# Patient Record
Sex: Male | Born: 1950 | Race: White | Hispanic: No | Marital: Married | Smoking: Former smoker
Health system: Southern US, Community
[De-identification: ages and names within clinical notes are randomized; demographics above are authoritative.]

## PROBLEM LIST (undated history)

## (undated) DIAGNOSIS — I499 Cardiac arrhythmia, unspecified: Secondary | ICD-10-CM

## (undated) DIAGNOSIS — I10 Essential (primary) hypertension: Secondary | ICD-10-CM

## (undated) DIAGNOSIS — I498 Other specified cardiac arrhythmias: Secondary | ICD-10-CM

## (undated) DIAGNOSIS — R351 Nocturia: Secondary | ICD-10-CM

## (undated) DIAGNOSIS — E119 Type 2 diabetes mellitus without complications: Secondary | ICD-10-CM

## (undated) DIAGNOSIS — E785 Hyperlipidemia, unspecified: Secondary | ICD-10-CM

## (undated) DIAGNOSIS — I493 Ventricular premature depolarization: Secondary | ICD-10-CM

## (undated) HISTORY — DX: Hyperlipidemia, unspecified: E78.5

## (undated) HISTORY — PX: CARDIAC CATHETERIZATION: SHX172

## (undated) HISTORY — DX: Type 2 diabetes mellitus without complications: E11.9

## (undated) HISTORY — PX: HERNIA REPAIR: SHX51

## (undated) HISTORY — PX: ADENOIDECTOMY AND MYRINGOTOMY WITH TUBE PLACEMENT: SHX5714

## (undated) HISTORY — DX: Other specified cardiac arrhythmias: I49.8

## (undated) HISTORY — DX: Essential (primary) hypertension: I10

## (undated) HISTORY — DX: Nocturia: R35.1

## (undated) HISTORY — DX: Ventricular premature depolarization: I49.3

## (undated) HISTORY — DX: Cardiac arrhythmia, unspecified: I49.9

---

## 2003-03-05 ENCOUNTER — Encounter: Payer: Self-pay | Admitting: Emergency Medicine

## 2003-03-05 ENCOUNTER — Emergency Department (HOSPITAL_COMMUNITY): Admission: EM | Admit: 2003-03-05 | Discharge: 2003-03-05 | Payer: Self-pay | Admitting: Emergency Medicine

## 2008-06-23 ENCOUNTER — Ambulatory Visit (HOSPITAL_COMMUNITY): Admission: EM | Admit: 2008-06-23 | Discharge: 2008-06-23 | Payer: Self-pay | Admitting: Emergency Medicine

## 2011-04-03 NOTE — Op Note (Signed)
NAME:  Benjamin Erickson, TROMPETER NO.:  [id number]   MEDICAL RECORD NO.:  [id number]          PATIENT TYPE:  EMS   LOCATION:  ED                           FACILITY:  [facility]   PHYSICIAN:  Zola Button T. Lazarus Salines, M.D. DATE OF BIRTH:  November 04, 1951   DATE OF PROCEDURE:  06/23/2008  DATE OF DISCHARGE:  06/23/2008                               OPERATIVE REPORT   PREOPERATIVE DIAGNOSIS:  Esophageal foreign body.   POSTOPERATIVE DIAGNOSIS:  Esophageal foreign body.   PROCEDURE PERFORMED:  Direct laryngoscopy, esophagoscopy with retrieval  of upper esophageal foreign body.   SURGEON:  Gloris Manchester. Lazarus Salines, MD   ANESTHESIA:  General orotracheal.   BLOOD LOSS:  None.   COMPLICATIONS:  Minor right lower lip contusion.   FINDINGS:  A roughly 3 x 2 x 5 cm impacted piece of apparent chicken  meat with what looks to be part of which bone imbedded therein with one  sharp and one sharp end.  No significant injury to the esophagus, and no  additional foreign bodies to the extension of the cervical  esophagoscope, full length.   PROCEDURE:  With the patient in a comfortable supine position, general  orotracheal anesthesia was induced without difficulty.  An appropriate  level, the table was turned 90 degrees, and the patient placed in  reverse Trendelenburg.  A rubber tooth guard was placed.  Taking care to  protect lips, teeth, and endotracheal tube, the large laser anterior  commissure laryngoscope was introduced and passed into the hypopharynx.  I could enter the tip into the cricopharyngeus and I could visualize the  foreign body but I could not be extended far enough owing to the bulk at  the proximal end.  The scope was removed.  The Hollinger laryngoscope  was introduced and through the cricopharyngeus and the apparent foreign  body was grasped with a large cup forceps and the foreign body, forceps,  and scope were all extracted together revealing a large foreign body  which was passed from  the field.  Reinspection with the Hollinger scope  revealed additional pieces of chicken which were smaller and which were  extracted.   The cervical esophagoscope was lubricated and introduced and there was a  small additional piece of foreign material just below the  cricopharyngeus, which was evacuated with a cup forceps.  The  esophagoscope was then passed into its full length with no additional  findings and it was removed.  The direct laryngoscope was reintroduced  and entire pharynx was carefully inspected to make sure there were no  residual foreign bodies floating around in the secretions.  The pharynx  was carefully suctioned to clear.  At this point, the procedure was  completed.  The rubber tooth guard was removed.  There was a contusion  to the right lower lip.  The foreign body was saved for the family.  The  patient was awakened, extubated, and transferred to the recovery in  stable condition.   COMMENT:  A 60 year old white male ingested a chunk of chicken with an  apparent bone contained therein as witnessed by CT scan of  approximately  8 hours ago.  This was retrieved.  Anticipate routine postoperative  recovery with attention to advancement of diet and possible reflux  therapy.  Given low anticipated risk of postanesthetic or postsurgical  complications, I feel outpatient venue is appropriate.      Gloris Manchester. Lazarus Salines, M.D.  Electronically Signed     KTW/MEDQ  D:  06/23/2008  T:  06/24/2008  Job:  [id number]

## 2011-04-03 NOTE — Consult Note (Signed)
NAME:  Benjamin Erickson, Benjamin Erickson NO.:  [id number]   MEDICAL RECORD NO.:  [id number]          PATIENT TYPE:  EMS   LOCATION:  ED                           FACILITY:  [facility]   PHYSICIAN:  Zola Button T. Lazarus Salines, M.D. DATE OF BIRTH:  10/04/1951   DATE OF CONSULTATION:  06/23/2008  DATE OF DISCHARGE:  06/23/2008                                 CONSULTATION   CHIEF COMPLAINT:  Food lodged in throat.   HISTORY:  A 60 year old white male was eating [employer] chicken for  lunch.  He took a large bite and swallowed and got obstructed where he  could not breathe.  His wife performed the Heimlich maneuver four times  and finally the food dislodged where he could breathe, and then, he  managed to partly swallow it and it got stuck.  It has remained stuck  ever since now approximately 6-1/2 hours.  There was some pain involved.  He is drooling slightly.  No breathing difficulty.  His voice is  slightly hoarse.  No past history of food impactions.  He does have  frequent reflux for which he uses simple antacid such as Tums, but has  never had dysphagia prior.  No blood.  No fever.   PHYSICAL EXAMINATION:  GENERAL:  This is a heavy-set distressed-  appearing middle-aged white male.  His voice is hoarse, but he is  breathing comfortably without stridor.  Mental status is basically  appropriate.  He has a somewhat bulky tongue.  NECK:  Unremarkable.  LUNGS:  Clear to auscultation.  HEART:  Regular rate and rhythm.  No murmurs.  ABDOMEN:  Obese, but active.   Soft tissue lateral of the neck shows no foreign body.   A CT scan of the neck does show soft tissue foreign body just behind the  larynx with an apparent central bone or bone density.  No evidence of  soft tissue air distant from the esophagus.   IMPRESSION:  High cervical esophageal food impaction with no current  airway threat.   PLAN:  I discussed this with the patient and his wife.  We need to go to  the operating room where  under general anesthesia, I will perform a  direct laryngoscopy in an attempt to extract the foreign body.  Beyond  this, he can probably go home and will likely not need any specific  medications.  I would recommend some over-the-counter Prilosec to help  control his reflux during the healing phase.   I discussed the surgery in detail including risks and complications.  Questions were answered and informed consent was obtained.  A routine  preoperative history and physical was recorded without  contraindications.      Gloris Manchester. Lazarus Salines, M.D.  Electronically Signed    KTW/MEDQ  D:  06/23/2008  T:  06/24/2008  Job:  [id number]

## 2011-08-17 LAB — POCT I-STAT, CHEM 8
Calcium, Ion: 1.18
Glucose, Bld: 139 — ABNORMAL HIGH
HCT: 54 — ABNORMAL HIGH
Hemoglobin: 18.4 — ABNORMAL HIGH

## 2016-06-26 ENCOUNTER — Encounter: Payer: Self-pay | Admitting: Skilled Nursing Facility1

## 2016-06-26 ENCOUNTER — Encounter: Payer: BLUE CROSS/BLUE SHIELD | Attending: Family Medicine | Admitting: Skilled Nursing Facility1

## 2016-06-26 DIAGNOSIS — E119 Type 2 diabetes mellitus without complications: Secondary | ICD-10-CM | POA: Insufficient documentation

## 2016-06-26 DIAGNOSIS — Z713 Dietary counseling and surveillance: Secondary | ICD-10-CM | POA: Diagnosis not present

## 2016-06-26 NOTE — Progress Notes (Signed)

## 2016-07-03 ENCOUNTER — Encounter: Payer: BLUE CROSS/BLUE SHIELD | Admitting: Dietician

## 2016-07-03 DIAGNOSIS — Z713 Dietary counseling and surveillance: Secondary | ICD-10-CM | POA: Diagnosis not present

## 2016-07-03 DIAGNOSIS — E119 Type 2 diabetes mellitus without complications: Secondary | ICD-10-CM

## 2016-07-05 NOTE — Progress Notes (Signed)
Patient was seen on 8/15/17for the second of a series of three diabetes self-management courses at the [facility]. The following learning objectives were met by the patient during this class:   Describe the role of different macronutrients on glucose  Explain how carbohydrates affect blood glucose  State what foods contain the most carbohydrates  Demonstrate carbohydrate counting  Demonstrate how to read Nutrition Facts food label  Describe effects of various fats on heart health  Describe the importance of good nutrition for health and healthy eating strategies  Describe techniques for managing your shopping, cooking and meal planning  List strategies to follow meal plan when dining out  Describe the effects of alcohol on glucose and how to use it safely  Goals:  Follow Diabetes Meal Plan as instructed  Eat 3 meals and 2 snacks, every 3-5 hrs  Limit carbohydrate intake to 45 grams carbohydrate/meal Limit carbohydrate intake to 15 grams carbohydrate/snack Add lean protein foods to meals/snacks  Monitor glucose levels as instructed by your doctor   Follow-Up Plan:  Attend Core 3  Work towards following your personal food plan.   

## 2016-07-10 ENCOUNTER — Encounter: Payer: BLUE CROSS/BLUE SHIELD | Admitting: Skilled Nursing Facility1

## 2016-07-10 DIAGNOSIS — Z713 Dietary counseling and surveillance: Secondary | ICD-10-CM | POA: Diagnosis not present

## 2016-07-10 DIAGNOSIS — E119 Type 2 diabetes mellitus without complications: Secondary | ICD-10-CM

## 2016-07-12 ENCOUNTER — Encounter: Payer: Self-pay | Admitting: Skilled Nursing Facility1

## 2016-07-12 NOTE — Progress Notes (Signed)
Patient was seen on 07/10/2016 for the third of a series of three diabetes self-management courses at the [facility]. The following learning objectives were met by the patient during this class:  . State the amount of activity recommended for healthy living . Describe activities suitable for individual needs . Identify ways to regularly incorporate activity into daily life . Identify barriers to activity and ways to over come these barriers  Identify diabetes medications being personally used and their primary action for lowering glucose and possible side effects . Describe role of stress on blood glucose and develop strategies to address psychosocial issues . Identify diabetes complications and ways to prevent them  Explain how to manage diabetes during illness . Evaluate success in meeting personal goal . Establish 2-3 goals that they will plan to diligently work on until they return for the  59-monthfollow-up visit  Goals:   I will count my carb choices at most meals and snacks  I will be active 30 minutes or more 6 times a week  I will take my diabetes medications as scheduled  I will eat less unhealthy fats by eating less snacks  To help manage stress I will  exercise at least 6 times a week   Your patient has identified these potential barriers to change:  Motivation Your patient has identified their diabetes self-care support plan as  Family Support  Plan:  Attend Monthly Diabetes Support Group as needed or make a future follow up appointment

## 2016-11-09 DIAGNOSIS — Z23 Encounter for immunization: Secondary | ICD-10-CM | POA: Diagnosis not present

## 2016-11-09 DIAGNOSIS — I10 Essential (primary) hypertension: Secondary | ICD-10-CM | POA: Diagnosis not present

## 2016-11-09 DIAGNOSIS — E119 Type 2 diabetes mellitus without complications: Secondary | ICD-10-CM | POA: Diagnosis not present

## 2016-11-09 DIAGNOSIS — E785 Hyperlipidemia, unspecified: Secondary | ICD-10-CM | POA: Diagnosis not present

## 2017-04-22 DIAGNOSIS — M7660 Achilles tendinitis, unspecified leg: Secondary | ICD-10-CM | POA: Diagnosis not present

## 2017-04-24 DIAGNOSIS — M7661 Achilles tendinitis, right leg: Secondary | ICD-10-CM | POA: Diagnosis not present

## 2017-04-25 DIAGNOSIS — S86011D Strain of right Achilles tendon, subsequent encounter: Secondary | ICD-10-CM | POA: Diagnosis not present

## 2017-04-25 DIAGNOSIS — M7661 Achilles tendinitis, right leg: Secondary | ICD-10-CM | POA: Diagnosis not present

## 2017-04-29 DIAGNOSIS — S86011D Strain of right Achilles tendon, subsequent encounter: Secondary | ICD-10-CM | POA: Diagnosis not present

## 2017-04-29 DIAGNOSIS — M7661 Achilles tendinitis, right leg: Secondary | ICD-10-CM | POA: Diagnosis not present

## 2017-05-02 DIAGNOSIS — M7661 Achilles tendinitis, right leg: Secondary | ICD-10-CM | POA: Diagnosis not present

## 2017-05-02 DIAGNOSIS — S86011D Strain of right Achilles tendon, subsequent encounter: Secondary | ICD-10-CM | POA: Diagnosis not present

## 2017-05-06 DIAGNOSIS — S86011D Strain of right Achilles tendon, subsequent encounter: Secondary | ICD-10-CM | POA: Diagnosis not present

## 2017-05-06 DIAGNOSIS — M7661 Achilles tendinitis, right leg: Secondary | ICD-10-CM | POA: Diagnosis not present

## 2017-05-09 DIAGNOSIS — S86011D Strain of right Achilles tendon, subsequent encounter: Secondary | ICD-10-CM | POA: Diagnosis not present

## 2017-05-09 DIAGNOSIS — M7661 Achilles tendinitis, right leg: Secondary | ICD-10-CM | POA: Diagnosis not present

## 2017-05-16 DIAGNOSIS — M7661 Achilles tendinitis, right leg: Secondary | ICD-10-CM | POA: Diagnosis not present

## 2017-05-16 DIAGNOSIS — S86011D Strain of right Achilles tendon, subsequent encounter: Secondary | ICD-10-CM | POA: Diagnosis not present

## 2017-05-20 DIAGNOSIS — M7661 Achilles tendinitis, right leg: Secondary | ICD-10-CM | POA: Diagnosis not present

## 2017-05-20 DIAGNOSIS — S86011D Strain of right Achilles tendon, subsequent encounter: Secondary | ICD-10-CM | POA: Diagnosis not present

## 2017-05-23 DIAGNOSIS — S86011D Strain of right Achilles tendon, subsequent encounter: Secondary | ICD-10-CM | POA: Diagnosis not present

## 2017-05-23 DIAGNOSIS — M7661 Achilles tendinitis, right leg: Secondary | ICD-10-CM | POA: Diagnosis not present

## 2017-05-27 DIAGNOSIS — M7661 Achilles tendinitis, right leg: Secondary | ICD-10-CM | POA: Diagnosis not present

## 2017-05-31 ENCOUNTER — Encounter: Payer: Self-pay | Admitting: Cardiology

## 2017-05-31 DIAGNOSIS — I493 Ventricular premature depolarization: Secondary | ICD-10-CM | POA: Diagnosis not present

## 2017-05-31 DIAGNOSIS — E785 Hyperlipidemia, unspecified: Secondary | ICD-10-CM | POA: Diagnosis not present

## 2017-05-31 DIAGNOSIS — Z125 Encounter for screening for malignant neoplasm of prostate: Secondary | ICD-10-CM | POA: Diagnosis not present

## 2017-05-31 DIAGNOSIS — K469 Unspecified abdominal hernia without obstruction or gangrene: Secondary | ICD-10-CM | POA: Diagnosis not present

## 2017-05-31 DIAGNOSIS — E119 Type 2 diabetes mellitus without complications: Secondary | ICD-10-CM | POA: Diagnosis not present

## 2017-05-31 DIAGNOSIS — I10 Essential (primary) hypertension: Secondary | ICD-10-CM | POA: Diagnosis not present

## 2017-05-31 DIAGNOSIS — Z0000 Encounter for general adult medical examination without abnormal findings: Secondary | ICD-10-CM | POA: Diagnosis not present

## 2017-06-06 ENCOUNTER — Telehealth: Payer: Self-pay | Admitting: Cardiology

## 2017-06-06 NOTE — Telephone Encounter (Signed)
Received records from [facility] for appointment on 07/09/17 with Dr Stanford Breed.  Records put with Dr Jacalyn Lefevre schedule for 07/09/17. lp

## 2017-06-27 DIAGNOSIS — K4021 Bilateral inguinal hernia, without obstruction or gangrene, recurrent: Secondary | ICD-10-CM | POA: Diagnosis not present

## 2017-07-04 NOTE — Progress Notes (Deleted)
    Daphine Deutscher, MD Reason for referral-PVCs, palpitations  HPI: 66 year old male for evaluation of PVCs and palpitations at request of London Pepper M.D.  Current Outpatient Prescriptions  Medication Sig Dispense Refill  . acetaminophen (TYLENOL) 325 MG tablet Take 650 mg by mouth every 6 (six) hours as needed.    Marland Kitchen lisinopril-hydrochlorothiazide (PRINZIDE,ZESTORETIC) 10-12.5 MG tablet Take 1 tablet by mouth daily.    . rosuvastatin (CRESTOR) 5 MG tablet Take 5 mg by mouth daily.     No current facility-administered medications for this visit.     Not on File  Past Medical History:  Diagnosis Date  . Bigeminy   . DM (diabetes mellitus) ([place])   . Frequent PVCs   . Hyperlipidemia   . Hypertension   . Nocturia     Past Surgical History:  Procedure Laterality Date  . ADENOIDECTOMY AND MYRINGOTOMY WITH TUBE PLACEMENT    . HERNIA REPAIR      Social History   Social History  . Marital status: Married    Spouse name: N/A  . Number of children: N/A  . Years of education: N/A   Occupational History  . Not on file.   Social History Main Topics  . Smoking status: Former Research scientist (life sciences)  . Smokeless tobacco: Not on file  . Alcohol use Not on file  . Drug use: Unknown  . Sexual activity: Not on file   Other Topics Concern  . Not on file   Social History Narrative  . No narrative on file    Family History  Problem Relation Age of Onset  . Skin cancer Mother   . Throat cancer Father   . Heart failure Father     ROS: no fevers or chills, productive cough, hemoptysis, dysphasia, odynophagia, melena, hematochezia, dysuria, hematuria, rash, seizure activity, orthopnea, PND, pedal edema, claudication. Remaining systems are negative.  Physical Exam:   There were no vitals taken for this visit.  General:  Well developed/well nourished in NAD Skin warm/dry Patient not depressed No peripheral clubbing Back-normal HEENT-normal/normal eyelids Neck supple/normal  carotid upstroke bilaterally; no bruits; no JVD; no thyromegaly chest - CTA/ normal expansion CV - RRR/normal S1 and S2; no murmurs, rubs or gallops;  PMI nondisplaced Abdomen -NT/ND, no HSM, no mass, + bowel sounds, no bruit 2+ femoral pulses, no bruits Ext-no edema, chords, 2+ DP Neuro-grossly nonfocal  ECG - personally reviewed  A/P  1  Kirk Ruths, MD

## 2017-07-09 ENCOUNTER — Ambulatory Visit: Payer: Medicare Other | Admitting: Cardiology

## 2017-12-03 DIAGNOSIS — R3912 Poor urinary stream: Secondary | ICD-10-CM | POA: Diagnosis not present

## 2017-12-03 DIAGNOSIS — N401 Enlarged prostate with lower urinary tract symptoms: Secondary | ICD-10-CM | POA: Diagnosis not present

## 2017-12-03 DIAGNOSIS — N5201 Erectile dysfunction due to arterial insufficiency: Secondary | ICD-10-CM | POA: Diagnosis not present

## 2017-12-03 DIAGNOSIS — N411 Chronic prostatitis: Secondary | ICD-10-CM | POA: Diagnosis not present

## 2018-01-17 DIAGNOSIS — I10 Essential (primary) hypertension: Secondary | ICD-10-CM | POA: Diagnosis not present

## 2018-01-17 DIAGNOSIS — E785 Hyperlipidemia, unspecified: Secondary | ICD-10-CM | POA: Diagnosis not present

## 2018-01-17 DIAGNOSIS — E119 Type 2 diabetes mellitus without complications: Secondary | ICD-10-CM | POA: Diagnosis not present

## 2018-05-19 DIAGNOSIS — N411 Chronic prostatitis: Secondary | ICD-10-CM | POA: Diagnosis not present

## 2018-05-19 DIAGNOSIS — R3912 Poor urinary stream: Secondary | ICD-10-CM | POA: Diagnosis not present

## 2018-05-19 DIAGNOSIS — N410 Acute prostatitis: Secondary | ICD-10-CM | POA: Diagnosis not present

## 2018-06-03 DIAGNOSIS — Z125 Encounter for screening for malignant neoplasm of prostate: Secondary | ICD-10-CM | POA: Diagnosis not present

## 2018-06-03 DIAGNOSIS — E119 Type 2 diabetes mellitus without complications: Secondary | ICD-10-CM | POA: Diagnosis not present

## 2018-06-03 DIAGNOSIS — Z7984 Long term (current) use of oral hypoglycemic drugs: Secondary | ICD-10-CM | POA: Diagnosis not present

## 2018-06-03 DIAGNOSIS — E291 Testicular hypofunction: Secondary | ICD-10-CM | POA: Diagnosis not present

## 2018-06-03 DIAGNOSIS — Z23 Encounter for immunization: Secondary | ICD-10-CM | POA: Diagnosis not present

## 2018-06-03 DIAGNOSIS — E785 Hyperlipidemia, unspecified: Secondary | ICD-10-CM | POA: Diagnosis not present

## 2018-06-03 DIAGNOSIS — I10 Essential (primary) hypertension: Secondary | ICD-10-CM | POA: Diagnosis not present

## 2018-06-03 DIAGNOSIS — Z0000 Encounter for general adult medical examination without abnormal findings: Secondary | ICD-10-CM | POA: Diagnosis not present

## 2018-09-01 DIAGNOSIS — Z23 Encounter for immunization: Secondary | ICD-10-CM | POA: Diagnosis not present

## 2018-10-24 DIAGNOSIS — R198 Other specified symptoms and signs involving the digestive system and abdomen: Secondary | ICD-10-CM | POA: Diagnosis not present

## 2018-11-10 DIAGNOSIS — N401 Enlarged prostate with lower urinary tract symptoms: Secondary | ICD-10-CM | POA: Diagnosis not present

## 2018-11-17 DIAGNOSIS — N400 Enlarged prostate without lower urinary tract symptoms: Secondary | ICD-10-CM | POA: Diagnosis not present

## 2018-11-17 DIAGNOSIS — R3912 Poor urinary stream: Secondary | ICD-10-CM | POA: Diagnosis not present

## 2018-12-04 DIAGNOSIS — E785 Hyperlipidemia, unspecified: Secondary | ICD-10-CM | POA: Diagnosis not present

## 2018-12-04 DIAGNOSIS — R972 Elevated prostate specific antigen [PSA]: Secondary | ICD-10-CM | POA: Diagnosis not present

## 2018-12-04 DIAGNOSIS — E669 Obesity, unspecified: Secondary | ICD-10-CM | POA: Diagnosis not present

## 2018-12-04 DIAGNOSIS — E119 Type 2 diabetes mellitus without complications: Secondary | ICD-10-CM | POA: Diagnosis not present

## 2018-12-04 DIAGNOSIS — I10 Essential (primary) hypertension: Secondary | ICD-10-CM | POA: Diagnosis not present

## 2018-12-04 DIAGNOSIS — E1169 Type 2 diabetes mellitus with other specified complication: Secondary | ICD-10-CM | POA: Diagnosis not present

## 2019-05-28 DIAGNOSIS — L814 Other melanin hyperpigmentation: Secondary | ICD-10-CM | POA: Diagnosis not present

## 2019-05-28 DIAGNOSIS — D1801 Hemangioma of skin and subcutaneous tissue: Secondary | ICD-10-CM | POA: Diagnosis not present

## 2019-05-28 DIAGNOSIS — Z85828 Personal history of other malignant neoplasm of skin: Secondary | ICD-10-CM | POA: Diagnosis not present

## 2019-05-28 DIAGNOSIS — L819 Disorder of pigmentation, unspecified: Secondary | ICD-10-CM | POA: Diagnosis not present

## 2019-05-28 DIAGNOSIS — L570 Actinic keratosis: Secondary | ICD-10-CM | POA: Diagnosis not present

## 2019-05-28 DIAGNOSIS — L821 Other seborrheic keratosis: Secondary | ICD-10-CM | POA: Diagnosis not present

## 2019-05-28 DIAGNOSIS — D229 Melanocytic nevi, unspecified: Secondary | ICD-10-CM | POA: Diagnosis not present

## 2019-09-30 ENCOUNTER — Other Ambulatory Visit: Payer: Self-pay

## 2019-09-30 DIAGNOSIS — Z20822 Contact with and (suspected) exposure to covid-19: Secondary | ICD-10-CM

## 2019-09-30 DIAGNOSIS — Z20828 Contact with and (suspected) exposure to other viral communicable diseases: Secondary | ICD-10-CM | POA: Diagnosis not present

## 2019-10-02 LAB — NOVEL CORONAVIRUS, NAA: SARS-CoV-2, NAA: DETECTED — AB

## 2019-10-06 ENCOUNTER — Emergency Department (HOSPITAL_COMMUNITY): Payer: Medicare Other

## 2019-10-06 ENCOUNTER — Emergency Department (HOSPITAL_COMMUNITY)
Admission: EM | Admit: 2019-10-06 | Discharge: 2019-10-06 | Disposition: A | Discharge status: Home / Self Care | Payer: Medicare Other | Attending: Emergency Medicine | Admitting: Emergency Medicine

## 2019-10-06 DIAGNOSIS — U071 COVID-19: Secondary | ICD-10-CM | POA: Diagnosis not present

## 2019-10-06 DIAGNOSIS — R0602 Shortness of breath: Secondary | ICD-10-CM | POA: Diagnosis not present

## 2019-10-06 DIAGNOSIS — R918 Other nonspecific abnormal finding of lung field: Secondary | ICD-10-CM | POA: Diagnosis not present

## 2019-10-06 DIAGNOSIS — Z79899 Other long term (current) drug therapy: Secondary | ICD-10-CM | POA: Insufficient documentation

## 2019-10-06 DIAGNOSIS — E119 Type 2 diabetes mellitus without complications: Secondary | ICD-10-CM | POA: Diagnosis not present

## 2019-10-06 DIAGNOSIS — Z87891 Personal history of nicotine dependence: Secondary | ICD-10-CM | POA: Insufficient documentation

## 2019-10-06 DIAGNOSIS — I10 Essential (primary) hypertension: Secondary | ICD-10-CM | POA: Diagnosis not present

## 2019-10-06 DIAGNOSIS — J80 Acute respiratory distress syndrome: Secondary | ICD-10-CM | POA: Diagnosis not present

## 2019-10-06 LAB — CBC WITH DIFFERENTIAL/PLATELET
Abs Immature Granulocytes: 0.04 10*3/uL (ref 0.00–0.07)
Basophils Absolute: 0 10*3/uL (ref 0.0–0.1)
Basophils Relative: 0 %
Eosinophils Absolute: 0 10*3/uL (ref 0.0–0.5)
Eosinophils Relative: 0 %
HCT: 52 % (ref 39.0–52.0)
Hemoglobin: 16.5 g/dL (ref 13.0–17.0)
Immature Granulocytes: 1 %
Lymphocytes Relative: 17 %
Lymphs Abs: 1.2 10*3/uL (ref 0.7–4.0)
MCH: 30.3 pg (ref 26.0–34.0)
MCHC: 31.7 g/dL (ref 30.0–36.0)
MCV: 95.4 fL (ref 80.0–100.0)
Monocytes Absolute: 1 10*3/uL (ref 0.1–1.0)
Monocytes Relative: 13 %
Neutro Abs: 5.2 10*3/uL (ref 1.7–7.7)
Neutrophils Relative %: 69 %
Platelets: 284 10*3/uL (ref 150–400)
RBC: 5.45 MIL/uL (ref 4.22–5.81)
RDW: 13.2 % (ref 11.5–15.5)
WBC: 7.5 10*3/uL (ref 4.0–10.5)
nRBC: 0 % (ref 0.0–0.2)

## 2019-10-06 LAB — BASIC METABOLIC PANEL
Anion gap: 12 (ref 5–15)
BUN: 21 mg/dL (ref 8–23)
CO2: 28 mmol/L (ref 22–32)
Calcium: 9.3 mg/dL (ref 8.9–10.3)
Chloride: 94 mmol/L — ABNORMAL LOW (ref 98–111)
Creatinine, Ser: 1.03 mg/dL (ref 0.61–1.24)
GFR calc Af Amer: >60 mL/min (ref >60)
GFR calc non Af Amer: >60 mL/min (ref >60)
Glucose, Bld: 151 mg/dL — ABNORMAL HIGH (ref 70–99)
Potassium: 4.7 mmol/L (ref 3.5–5.1)
Sodium: 134 mmol/L — ABNORMAL LOW (ref 135–145)

## 2019-10-06 NOTE — ED Notes (Signed)
SpO2- 97% Room Air, Laying SpO2- 97% Room Air, Sitting SpO2- 96% Room Air, Standing  Pt Shob became worse per pt with standing & walking.

## 2019-10-06 NOTE — ED Triage Notes (Signed)
Pt tested positive for Covid 19. Pt reports having increased shortness of breath. EMS reports pt speaks in full sentences effortlessly. Pt AO x4 and ambulates.

## 2019-10-06 NOTE — ED Provider Notes (Signed)
[facility] DEPT Provider Note   CSN: [id number] Arrival date & time: 10/06/19  0941     History   Chief Complaint Chief Complaint  Patient presents with  . Covid Positive    HPI Benjamin Erickson is a 68 y.o. male.     HPI Patient presents to the ED for evaluation of shortness of breath.  Started having symptoms last week.  He went to his doctor and was tested on November 11.  He tested positive for coronavirus.  Patient has been having trouble with cough.  He has not measured his temperature at home so no known fevers.  He has had some trouble with myalgias as well as diarrhea.  Over the last several days he has felt increasing shortness of breath.  Patient finds it more difficult to breathe.  He was concerned so he came to the ED.  Patient does not have any history of lung disease.  No history of PE or COPD.  No history of CHF. Past Medical History:  Diagnosis Date  . Bigeminy   . DM (diabetes mellitus) ([place])   . Frequent PVCs   . Hyperlipidemia   . Hypertension   . Nocturia     There are no active problems to display for this patient.   Past Surgical History:  Procedure Laterality Date  . ADENOIDECTOMY AND MYRINGOTOMY WITH TUBE PLACEMENT    . HERNIA REPAIR          Home Medications    Prior to Admission medications   Medication Sig Start Date End Date Taking? Authorizing Provider  acetaminophen (TYLENOL) 325 MG tablet Take 650 mg by mouth every 6 (six) hours as needed.    [provider]  lisinopril-hydrochlorothiazide (PRINZIDE,ZESTORETIC) 10-12.5 MG tablet Take 1 tablet by mouth daily.    [provider]  rosuvastatin (CRESTOR) 5 MG tablet Take 5 mg by mouth daily.    [provider]    Family History Family History  Problem Relation Age of Onset  . Skin cancer Mother   . Throat cancer Father   . Heart failure Father     Social History Social History   Tobacco Use  . Smoking status:  Former Smoker  Substance Use Topics  . Alcohol use: Not on file  . Drug use: Not on file     Allergies   Sulfa antibiotics   Review of Systems Review of Systems  Cardiovascular: Negative for chest pain.  Gastrointestinal: Positive for diarrhea. Negative for vomiting.  Genitourinary: Negative for dysuria.  All other systems reviewed and are negative.    Physical Exam Updated Vital Signs BP (!) 128/96   Pulse (!) 117   Temp 98.2 F (36.8 C) (Oral)   Resp 14   Ht 1.727 m (5\' 8" )   Wt 111.1 kg   SpO2 95%   BMI 37.25 kg/m   Physical Exam Vitals signs and nursing note reviewed.  Constitutional:      General: He is not in acute distress.    Appearance: He is well-developed.  HENT:     Head: Normocephalic and atraumatic.     Right Ear: External ear normal.     Left Ear: External ear normal.  Eyes:     General: No scleral icterus.       Right eye: No discharge.        Left eye: No discharge.     Conjunctiva/sclera: Conjunctivae normal.  Neck:     Musculoskeletal: Neck supple.  Trachea: No tracheal deviation.  Cardiovascular:     Rate and Rhythm: Regular rhythm. Tachycardia present.  Pulmonary:     Effort: Pulmonary effort is normal. No respiratory distress.     Breath sounds: Normal breath sounds. No stridor. No wheezing or rales.  Abdominal:     General: Bowel sounds are normal. There is no distension.     Palpations: Abdomen is soft.     Tenderness: There is no abdominal tenderness. There is no guarding or rebound.  Musculoskeletal:        General: No tenderness.  Skin:    General: Skin is warm and dry.     Findings: No rash.  Neurological:     Mental Status: He is alert.     Cranial Nerves: No cranial nerve deficit (no facial droop, extraocular movements intact, no slurred speech).     Sensory: No sensory deficit.     Motor: No abnormal muscle tone or seizure activity.     Coordination: Coordination normal.      ED Treatments / Results  Labs  (all labs ordered are listed, but only abnormal results are displayed) Labs Reviewed  BASIC METABOLIC PANEL - Abnormal; Notable for the following components:      Result Value   Sodium 134 (*)    Chloride 94 (*)    Glucose, Bld 151 (*)    All other components within normal limits  CBC WITH DIFFERENTIAL/PLATELET    EKG EKG Interpretation  Date/Time:  Tuesday October 06 2019 10:02:01 EST Ventricular Rate:  112 PR Interval:    QRS Duration: 113 QT Interval:  317 QTC Calculation: 433 R Axis:   -50 Text Interpretation: Sinus tachycardia Ventricular premature complex LAD, consider left anterior fascicular block Consider anterior infarct No old tracing to compare Confirmed by Dorie Rank [phone]) on 10/06/2019 10:20:01 AM   Radiology Dg Chest Portable 1 View  Result Date: 10/06/2019 CLINICAL DATA:  Weakness. COVID positive with increasing shortness of breath. EXAM: PORTABLE CHEST 1 VIEW COMPARISON:  None FINDINGS: Normal heart size. Decreased lung volumes with asymmetric elevation of the right hemidiaphragm. Mild asymmetric opacity within the right upper lobe is identified, nonspecific. Left lung appears clear. IMPRESSION: 1. Lungs are suboptimally inflated. 2. Subtle asymmetric opacity within the right upper lobe may represent early pneumonia. Electronically Signed   By: Kerby Moors M.D.   On: 10/06/2019 10:38    Procedures Procedures (including critical care time)  Medications Ordered in ED Medications - No data to display   Initial Impression / Assessment and Plan / ED Course  I have reviewed the triage vital signs and the nursing notes.  Pertinent labs & imaging results that were available during my care of the patient were reviewed by me and considered in my medical decision making (see chart for details).  Clinical Course as of Oct 06 1203  Tue Oct 06, 2019  1143 Labs without significant abnormalities.   [id number] Chest x-ray shows subtle asymmetric opacity.   Possible early infiltrate   [JK]  1144 Patient ambulated in the ED but no hypoxia noted.   [JK]    Clinical Course User Index [JK] Dorie Rank, MD     Patient has known Covid virus infection.  Vital signs show tachycardia but the patient has no tachypnea.  Patient's oxygen saturation has remained normal even while walking around in his room.  Questionable infiltrate on chest x-ray.  At this time no clear indication for hospitalization but we will certainly need to  continue to monitor closely.  Cautioned the patient that his symptoms could worsen and if he feels that his breathing is getting worse he needs to return to the ED.  At this point stable for continued outpatient management.  Benjamin Erickson was evaluated in Emergency Department on 10/06/2019 for the symptoms described in the history of present illness. He was evaluated in the context of the global COVID-19 pandemic, which necessitated consideration that the patient might be at risk for infection with the SARS-CoV-2 virus that causes COVID-19. Institutional protocols and algorithms that pertain to the evaluation of patients at risk for COVID-19 are in a state of rapid change based on information released by regulatory bodies including the CDC and federal and state organizations. These policies and algorithms were followed during the patient's care in the ED.   Final Clinical Impressions(s) / ED Diagnoses   Final diagnoses:  COVID-19 virus infection    ED Discharge Orders    None       Dorie Rank, MD 10/06/19 1204

## 2019-10-06 NOTE — Discharge Instructions (Signed)
As we discussed your laboratory tests are reassuring.  Your chest x-ray suggests the possibility of an early infiltrate.  Your oxygen fortunately has remained normal despite the covid virus infection affecting your breathing. You can try over the counter zing and vitamin d to see if that helps.  Some studies have suggested a possible benefit but that is not certain.  Continue to monitor your symptoms and return to the ED for increasing shortness of breath.

## 2020-02-25 ENCOUNTER — Encounter: Payer: Self-pay | Admitting: Cardiology

## 2020-02-25 DIAGNOSIS — E1169 Type 2 diabetes mellitus with other specified complication: Secondary | ICD-10-CM | POA: Diagnosis not present

## 2020-02-25 DIAGNOSIS — E785 Hyperlipidemia, unspecified: Secondary | ICD-10-CM | POA: Diagnosis not present

## 2020-02-25 DIAGNOSIS — Z0000 Encounter for general adult medical examination without abnormal findings: Secondary | ICD-10-CM | POA: Diagnosis not present

## 2020-02-25 DIAGNOSIS — R972 Elevated prostate specific antigen [PSA]: Secondary | ICD-10-CM | POA: Diagnosis not present

## 2020-02-25 DIAGNOSIS — I10 Essential (primary) hypertension: Secondary | ICD-10-CM | POA: Diagnosis not present

## 2020-02-25 DIAGNOSIS — R0602 Shortness of breath: Secondary | ICD-10-CM | POA: Diagnosis not present

## 2020-02-29 DIAGNOSIS — R072 Precordial pain: Secondary | ICD-10-CM | POA: Insufficient documentation

## 2020-02-29 DIAGNOSIS — R0602 Shortness of breath: Secondary | ICD-10-CM | POA: Insufficient documentation

## 2020-02-29 DIAGNOSIS — Z7189 Other specified counseling: Secondary | ICD-10-CM | POA: Insufficient documentation

## 2020-02-29 NOTE — Progress Notes (Signed)
Cardiology Office Note   Date:  03/01/2020   ID:  Benjamin Erickson, DOB 1951/10/29, MRN [id number]  PCP:  London Pepper, MD  Cardiologist:   Minus Breeding, MD Referring:  London Pepper, MD  Chief Complaint  Patient presents with  . Chest Pain      History of Present Illness: Benjamin Erickson is a 69 y.o. male who presents for evaluation of chest discomfort.  He has had no prior cardiac history.  He says that he has been developing chest discomfort for many weeks.  It happens when he gets up in the morning.  It is 5 out of 10 in intensity.  He tries to stretch to get rid of it but it does not go away.  It is a dull ache in the mid chest without radiation.  There is no associated nausea vomiting or diaphoresis.  It does not radiate to his jaw or to his arms.  He does not have PND or orthopnea.  He does not have presyncope or syncope.  He might get short of breath with this discomfort.  He says people noticed him breathing heavily quite frequently.  They will say something to him.  This does not have to be with exertion.   He is not describing apnea although he does snore.  He is not had any edema.   Past Medical History:  Diagnosis Date  . Bigeminy   . DM (diabetes mellitus) ([place])   . Frequent PVCs   . Hyperlipidemia   . Hypertension   . Nocturia     Past Surgical History:  Procedure Laterality Date  . HERNIA REPAIR     Inguinal      Current Outpatient Medications  Medication Sig Dispense Refill  . acetaminophen (TYLENOL) 325 MG tablet Take 650 mg by mouth every 6 (six) hours as needed.    Marland Kitchen lisinopril-hydrochlorothiazide (PRINZIDE,ZESTORETIC) 10-12.5 MG tablet Take 1 tablet by mouth daily.    . metFORMIN (GLUCOPHAGE) 500 MG tablet Take 500 mg by mouth 2 (two) times daily.    . rosuvastatin (CRESTOR) 5 MG tablet Take 5 mg by mouth daily.    . metoprolol tartrate (LOPRESSOR) 100 MG tablet Take 1 tablet (100 mg total) by mouth once for 1 dose. 1 tablet 0   No current  facility-administered medications for this visit.    Allergies:   Sulfa antibiotics    Social History:  The patient  reports that he has quit smoking. His smoking use included cigarettes. He has never used smokeless tobacco. He reports previous alcohol use. He reports previous drug use.   Family History:  The patient's family history includes CAD in his father; Heart failure in his father; Skin cancer in his mother; Throat cancer in his father.    ROS:  Please see the history of present illness.   Otherwise, review of systems are positive for none.   All other systems are reviewed and negative.    PHYSICAL EXAM: VS:  BP 140/80   Pulse 76   Temp 97.9 F (36.6 C)   Ht 5\' 9"  (1.753 m)   Wt 244 lb (110.7 kg)   SpO2 97%   BMI 36.03 kg/m  , BMI Body mass index is 36.03 kg/m. GENERAL:  Well appearing HEENT:  Pupils equal round and reactive, fundi not visualized, oral mucosa unremarkable NECK:  No jugular venous distention, waveform within normal limits, carotid upstroke brisk and symmetric, no bruits, no thyromegaly LYMPHATICS:  No cervical, inguinal adenopathy  LUNGS:  Clear to auscultation bilaterally BACK:  No CVA tenderness CHEST:  Unremarkable HEART:  PMI not displaced or sustained,S1 and S2 within normal limits, no S3, no S4, no clicks, no rubs, no murmurs ABD:  Flat, positive bowel sounds normal in frequency in pitch, no bruits, no rebound, no guarding, no midline pulsatile mass, no hepatomegaly, no splenomegaly EXT:  2 plus pulses throughout, no edema, no cyanosis no clubbing SKIN:  No rashes no nodules NEURO:  Cranial nerves II through XII grossly intact, motor grossly intact throughout PSYCH:  Cognitively intact, oriented to person place and time   EKG:  EKG is ordered today. The ekg ordered today demonstrates sinus rhythm, rate 76, left axis deviation, left anterior fascicular block, borderline interventricular conduction delay, poor anterior R wave  progression.   Recent Labs: 10/06/2019: BUN 21; Creatinine, Ser 1.03; Hemoglobin 16.5; Platelets 284; Potassium 4.7; Sodium 134    Lipid Panel No results found for: CHOL, TRIG, HDL, CHOLHDL, VLDL, LDLCALC, LDLDIRECT    Wt Readings from Last 3 Encounters:  03/01/20 244 lb (110.7 kg)  10/06/19 245 lb (111.1 kg)  06/26/16 244 lb (110.7 kg)      Other studies Reviewed: Additional studies/ records that were reviewed today include: Labs. Review of the above records demonstrates:  Please see elsewhere in the note.     ASSESSMENT AND PLAN:  SOB:   The pretest probability of obstructive coronary disease is at least moderate given his risk factors.  He has some baseline EKG changes.  I do not think treadmill testing will be high sensitivity.  I am going to order a coronary CT angiogram.  CHEST PAIN: This will be evaluated as above.  DYSLIPIDEMIA: His LDL recently was 179.  He has been intolerant of statins but he has been given a prescription by his primary provider for Livalo.  I reviewed the labs from the primary care office.  I think this is reasonable to try this although I would have a low threshold given his risk factors to switch to PCSK9 inhibitors if he does not tolerate this or if he has LDL is at goal which is old in the 51s.  HTN: His blood pressure is controlled.  No change in therapy.  DM: A1c was most recently 7.1.  He will continue the meds as listed.  COVID EDUCATION:    Current medicines are reviewed at length with the patient today.  The patient does not have concerns regarding medicines.  The following changes have been made:  no change  Labs/ tests ordered today include:   Orders Placed This Encounter  Procedures  . CT CORONARY MORPH W/CTA COR W/SCORE W/CA W/CM &/OR WO/CM  . CT CORONARY FRACTIONAL FLOW RESERVE DATA PREP  . CT CORONARY FRACTIONAL FLOW RESERVE FLUID ANALYSIS  . Basic metabolic panel  . EKG 12-Lead     Disposition:   FU with me in 2  months.     Signed, Minus Breeding, MD  03/01/2020 8:33 PM    [place]

## 2020-03-01 ENCOUNTER — Encounter: Payer: Self-pay | Admitting: Cardiology

## 2020-03-01 ENCOUNTER — Other Ambulatory Visit: Payer: Self-pay

## 2020-03-01 ENCOUNTER — Ambulatory Visit (INDEPENDENT_AMBULATORY_CARE_PROVIDER_SITE_OTHER): Payer: Medicare Other | Admitting: Cardiology

## 2020-03-01 VITALS — BP 140/80 | HR 76 | Temp 97.9°F | Ht 69.0 in | Wt 244.0 lb

## 2020-03-01 DIAGNOSIS — Z7189 Other specified counseling: Secondary | ICD-10-CM

## 2020-03-01 DIAGNOSIS — R072 Precordial pain: Secondary | ICD-10-CM

## 2020-03-01 DIAGNOSIS — Z01812 Encounter for preprocedural laboratory examination: Secondary | ICD-10-CM

## 2020-03-01 DIAGNOSIS — R0602 Shortness of breath: Secondary | ICD-10-CM

## 2020-03-01 MED ORDER — METOPROLOL TARTRATE 100 MG PO TABS: 100.0000 mg | ORAL_TABLET | Freq: Once | ORAL | 0 refills | Status: DC

## 2020-03-01 NOTE — Patient Instructions (Addendum)
Medication Instructions:  TAKE 100MG  OF METOPROLOL 2 HOURS BEFORE YOUR CT SCAN *If you need a refill on your cardiac medications before your next appointment, please call your pharmacy*  Lab Work: Your physician recommends that you return for lab work ONE WEEK BEFORE YOUR CT SCAN (BMP)  Testing/Procedures: CORONARY CTA  Follow-Up: At [facility], you and your health needs are our priority.  As part of our continuing mission to provide you with exceptional heart care, we have created designated Provider Care Teams.  These Care Teams include your primary Cardiologist (physician) and Advanced Practice Providers (APPs -  Physician Assistants and Nurse Practitioners) who all work together to provide you with the care you need, when you need it.  Your next appointment:   3 month(s)  You will receive a reminder letter in the mail two months in advance. If you don't receive a letter, please call our office to schedule the follow-up appointment.  The format for your next appointment:   In Person  Provider:   Minus Breeding, MD   Other instructions: Your cardiac CT will be scheduled at one of the below locations:   [facility] [address] [place], [place] [address] [phone]  If scheduled at [facility], please arrive at the [facility] main entrance of [facility] 30 minutes prior to test start time. Proceed to the [facility] Radiology Department (first floor) to check-in and test prep.  Please follow these instructions carefully (unless otherwise directed):  Hold all erectile dysfunction medications at least 3 days (72 hrs) prior to test.  On the Night Before the Test: . Be sure to Drink plenty of water. . Do not consume any caffeinated/decaffeinated beverages or chocolate 12 hours prior to your test. . Do not take any antihistamines 12 hours prior to your test. . If you take Metformin do not take 24 hours prior to test.  On the Day of the  Test: . Drink plenty of water. Do not drink any water within one hour of the test. . Do not eat any food 4 hours prior to the test. . You may take your regular medications prior to the test.  . Take metoprolol (Lopressor) 100 MG two hours prior to test. . HOLD Furosemide/Hydrochlorothiazide morning of the test.  After the Test: . Drink plenty of water. . After receiving IV contrast, you may experience a mild flushed feeling. This is normal. . On occasion, you may experience a mild rash up to 24 hours after the test. This is not dangerous. If this occurs, you can take Benadryl 25 mg and increase your fluid intake. . If you experience trouble breathing, this can be serious. If it is severe call 911 IMMEDIATELY. If it is mild, please call our office. . If you take any of these medications: Glipizide/Metformin, Avandament, Glucavance, please do not take 48 hours after completing test unless otherwise instructed.   Once we have confirmed authorization from your insurance company, we will call you to set up a date and time for your test.   For non-scheduling related questions, please contact the cardiac imaging nurse navigator should you have any questions/concerns: Marchia Bond, RN Navigator Cardiac Imaging [facility] [phone] office  For scheduling needs, including cancellations and rescheduling, please call [phone].

## 2020-03-18 DIAGNOSIS — Z01812 Encounter for preprocedural laboratory examination: Secondary | ICD-10-CM | POA: Diagnosis not present

## 2020-03-18 LAB — BASIC METABOLIC PANEL
BUN/Creatinine Ratio: 21 (ref 10–24)
BUN: 24 mg/dL (ref 8–27)
CO2: 23 mmol/L (ref 20–29)
Calcium: 9.7 mg/dL (ref 8.6–10.2)
Chloride: 97 mmol/L (ref 96–106)
Creatinine, Ser: 1.12 mg/dL (ref 0.76–1.27)
GFR calc Af Amer: 78 mL/min/{1.73_m2} (ref >59)
GFR calc non Af Amer: 67 mL/min/{1.73_m2} (ref >59)
Glucose: 116 mg/dL — ABNORMAL HIGH (ref 65–99)
Potassium: 5 mmol/L (ref 3.5–5.2)
Sodium: 137 mmol/L (ref 134–144)

## 2020-03-23 ENCOUNTER — Telehealth (HOSPITAL_COMMUNITY): Payer: Self-pay | Admitting: *Deleted

## 2020-03-23 NOTE — Telephone Encounter (Signed)
Reaching out to patient to offer assistance regarding upcoming cardiac imaging study; pt verbalizes understanding of appt date/time, parking situation and where to check in, pre-test NPO status and medications ordered, and verified current allergies; name and call back number provided for further questions should they arise Rashad Obeid Tai RN Navigator Cardiac Imaging [facility] [phone] office [phone] cell 

## 2020-03-24 ENCOUNTER — Other Ambulatory Visit: Payer: Self-pay

## 2020-03-24 ENCOUNTER — Ambulatory Visit (HOSPITAL_COMMUNITY)
Admission: RE | Admit: 2020-03-24 | Discharge: 2020-03-24 | Disposition: A | Discharge status: Home / Self Care | Payer: Medicare Other | Source: Ambulatory Visit | Attending: Cardiology | Admitting: Cardiology

## 2020-03-24 DIAGNOSIS — R072 Precordial pain: Secondary | ICD-10-CM

## 2020-03-24 MED ORDER — NITROGLYCERIN 0.4 MG SL SUBL
0.8000 mg | SUBLINGUAL_TABLET | Freq: Once | SUBLINGUAL | Status: AC
  Administered 2020-03-24: 08:00:00 0.8 mg via SUBLINGUAL

## 2020-03-24 MED ORDER — SODIUM CHLORIDE 0.9 % IV BOLUS
250.0000 mL | Freq: Once | INTRAVENOUS | Status: AC
  Administered 2020-03-24: 250 mL via INTRAVENOUS

## 2020-03-24 MED ORDER — IOHEXOL 350 MG/ML SOLN
80.0000 mL | Freq: Once | INTRAVENOUS | Status: AC | PRN
  Administered 2020-03-24: 09:00:00 80 mL via INTRAVENOUS

## 2020-03-24 MED ORDER — NITROGLYCERIN 0.4 MG SL SUBL
SUBLINGUAL_TABLET | SUBLINGUAL | Status: AC
  Filled 2020-03-24: qty 2

## 2020-03-29 ENCOUNTER — Ambulatory Visit (HOSPITAL_COMMUNITY)
Admission: RE | Admit: 2020-03-29 | Discharge: 2020-03-29 | Disposition: A | Discharge status: Home / Self Care | Payer: Medicare Other | Source: Ambulatory Visit | Attending: Cardiology | Admitting: Cardiology

## 2020-03-29 DIAGNOSIS — R072 Precordial pain: Secondary | ICD-10-CM

## 2020-05-03 DIAGNOSIS — E785 Hyperlipidemia, unspecified: Secondary | ICD-10-CM | POA: Diagnosis not present

## 2020-05-10 ENCOUNTER — Ambulatory Visit (INDEPENDENT_AMBULATORY_CARE_PROVIDER_SITE_OTHER): Payer: Medicare Other | Admitting: Pharmacist

## 2020-05-10 ENCOUNTER — Other Ambulatory Visit: Payer: Self-pay

## 2020-05-10 DIAGNOSIS — E785 Hyperlipidemia, unspecified: Secondary | ICD-10-CM

## 2020-05-10 NOTE — Patient Instructions (Addendum)
Your Results:             Your most recent labs Goal  Total Cholesterol 280 < 200  Triglycerides 313 < 150  HDL (happy/good cholesterol) 40 > 40  LDL (lousy/bad cholesterol 179 < 70     Medication changes: *WILL start prior authorization for Praluent 150mg  every 14 days*  Lab orders: *Plan to repeat fasting blood work 3  Months after initiating Praluent*  Patient Assistance:  The [employer] foundation offers assistance to help pay for medication copays.  They will cover copays for all cholesterol lowering meds, including statins, fibrates, omega-3 oils, ezetimibe, Repatha, Praluent, Nexletol, Nexlizet.  The cards are usually good for $2,500 or 12 months, whichever comes first. 1. Go to healthwellfoundation.org 2. Click on "Apply Now" 3. Answer questions as to whom is applying (patient or representative) 4. Your disease fund will be "hypercholesterolemia - Medicare access" 5. They will ask questions about finances and which medications you are taking for cholesterol 6. When you submit, the approval is usually within minutes.  You will need to print the card information from the site 7. You will need to show this information to your pharmacy, they will bill your Medicare Part D plan first -then bill [employer] --for the copay.   You can also call them at [phone], although the hold times can be quite long.   Thank you for choosing [facility]

## 2020-05-10 NOTE — Progress Notes (Signed)
Patient ID: Benjamin Erickson                 DOB: May 03, 1951                    MRN: [id number]     HPI:  Benjamin Erickson is a 69 y.o. male patient referred to lipid clinic by Dr Percival Spanish. PMH is significant for DM, hyperlipidemia, hypertension, hx of chest pain/angina. Calcium artery score placed patient at 94th percentile for age and gender, suggesting high risk for future cardiac events. Trying to control cholesterol for last 3 years and already lost 10 pound by working on portion control and positive lifestyle modifications.  Patient tried multiple statins in the past as well as fish oil and has problems tolerating therapy.  He presents for potential PCSK9 initiation and counseling.   Current Medications: none  Intolerances:  livalo (pituvastatin) 2mg  daily - severe rash rosuvastatin 5mg  daily - severe pain Fish-oil - bad taste and bad breath  LDL goal: < 70 mg/dL  Diet: portion control , cutting sugars   Exercise: yard work  Family History: The patient's family history includes CAD in his father; Heart failure in his father; Skin cancer in his mother; Throat cancer in his father.   Social History: The patient  reports that he has quit smoking. His smoking use included cigarettes. He has never used smokeless tobacco. He reports previous alcohol use. He reports previous drug use.   Labs:  02-25-2020: CHO 280, TG 313; HDL 40, LDL-c 179  Past Medical History:  Diagnosis Date  . Bigeminy   . DM (diabetes mellitus) ([place])   . Frequent PVCs   . Hyperlipidemia   . Hypertension   . Nocturia     Current Outpatient Medications on File Prior to Visit  Medication Sig Dispense Refill  . acetaminophen (TYLENOL) 325 MG tablet Take 650 mg by mouth every 6 (six) hours as needed.    Marland Kitchen lisinopril-hydrochlorothiazide (PRINZIDE,ZESTORETIC) 10-12.5 MG tablet Take 1 tablet by mouth daily.    . metFORMIN (GLUCOPHAGE) 500 MG tablet Take 500 mg by mouth 2 (two) times daily.     No current  facility-administered medications on file prior to visit.    Allergies  Allergen Reactions  . Sulfa Antibiotics     Hyperlipidemia with target LDL less than 70 Calcium score at 94th percentile and high risk for cardiovascular disease. Also noted history of diabetes and hypertension. Patient unable to tolerate pituvastatin, rosuvastatin or fish-oil. Continue to work on diet and exercise.   Counseling today covered: Praluent/Repatha indication, MOA, prior-authorization process, storage, administration, common side effects, monitoring, and financial assistance.   Paperwork for Praluent 150mg  every 14 days will be submitted and follow up as needed. Plan to repeat fasting lipid panel around  5th injection.    Benjamin Erickson PharmD, BCPS, Benjamin Erickson [id number] 05/12/2020 1:02 PM

## 2020-05-12 ENCOUNTER — Encounter: Payer: Self-pay | Admitting: Pharmacist

## 2020-05-12 DIAGNOSIS — I10 Essential (primary) hypertension: Secondary | ICD-10-CM | POA: Insufficient documentation

## 2020-05-12 DIAGNOSIS — E785 Hyperlipidemia, unspecified: Secondary | ICD-10-CM | POA: Insufficient documentation

## 2020-05-12 DIAGNOSIS — E109 Type 1 diabetes mellitus without complications: Secondary | ICD-10-CM | POA: Insufficient documentation

## 2020-05-12 NOTE — Assessment & Plan Note (Signed)
Calcium score at 94th percentile and high risk for cardiovascular disease. Also noted history of diabetes and hypertension. Patient unable to tolerate pituvastatin, rosuvastatin or fish-oil. Continue to work on diet and exercise.   Counseling today covered: Praluent/Repatha indication, MOA, prior-authorization process, storage, administration, common side effects, monitoring, and financial assistance.   Paperwork for [employer] 150mg  every 14 days will be submitted and follow up as needed. Plan to repeat fasting lipid panel around  5th injection.

## 2020-05-16 ENCOUNTER — Telehealth: Payer: Self-pay

## 2020-05-16 MED ORDER — PRALUENT 150 MG/ML ~~LOC~~ SOAJ: 150.0000 mg | SUBCUTANEOUS | 11 refills | Status: DC

## 2020-05-16 NOTE — Telephone Encounter (Signed)
lmomed the pt that they were approved for praluent rx sent and pt instructed to call back if unaffordable

## 2020-05-31 DIAGNOSIS — E785 Hyperlipidemia, unspecified: Secondary | ICD-10-CM | POA: Diagnosis not present

## 2020-05-31 DIAGNOSIS — I10 Essential (primary) hypertension: Secondary | ICD-10-CM | POA: Diagnosis not present

## 2020-05-31 DIAGNOSIS — E1169 Type 2 diabetes mellitus with other specified complication: Secondary | ICD-10-CM | POA: Diagnosis not present

## 2020-06-02 DIAGNOSIS — I25110 Atherosclerotic heart disease of native coronary artery with unstable angina pectoris: Secondary | ICD-10-CM | POA: Insufficient documentation

## 2020-06-02 DIAGNOSIS — E118 Type 2 diabetes mellitus with unspecified complications: Secondary | ICD-10-CM | POA: Insufficient documentation

## 2020-06-02 DIAGNOSIS — I2510 Atherosclerotic heart disease of native coronary artery without angina pectoris: Secondary | ICD-10-CM | POA: Insufficient documentation

## 2020-06-02 NOTE — Progress Notes (Signed)
Cardiology Office Note   Date:  06/03/2020   ID:  PETE SCHNITZER, DOB [birth year], MRN [id number]  PCP:  London Pepper, MD  Cardiologist:   Minus Breeding, MD Referring:  London Pepper, MD  Chief Complaint  Patient presents with   Coronary Artery Disease      History of Present Illness: Benjamin Erickson is a 69 y.o. male who presents for evaluation of chest discomfort.   He had a CT with a calcium score of 1805.    He had diffuse non obstructive plaque as described below.  Unfortunately, FFR not able to be performed secondary to artifact.   He does get some chest discomfort early in the morning when he first wakes up.  However, this eventually goes away.  He is able to do vigorous things like push a lawnmower without bringing on any discomfort the rest of the day.  The discomfort is somewhat moderate but not associated with other symptoms such as nausea vomiting or diaphoresis.  He has no shortness of breath, PND or orthopnea.  There is no radiation to his jaw or to his arms.   Past Medical History:  Diagnosis Date   Bigeminy    DM (diabetes mellitus) ([place])    Frequent PVCs    Hyperlipidemia    Hypertension    Nocturia     Past Surgical History:  Procedure Laterality Date   HERNIA REPAIR     Inguinal      Current Outpatient Medications  Medication Sig Dispense Refill   acetaminophen (TYLENOL) 325 MG tablet Take 650 mg by mouth every 6 (six) hours as needed.     Alirocumab (PRALUENT) 150 MG/ML SOAJ Inject 150 mg into the skin every 14 (fourteen) days. 2 pen 11   aspirin EC 81 MG tablet Take 81 mg by mouth daily. Swallow whole.     lisinopril-hydrochlorothiazide (PRINZIDE,ZESTORETIC) 10-12.5 MG tablet Take 1 tablet by mouth daily.     metFORMIN (GLUCOPHAGE) 500 MG tablet Take 500 mg by mouth 2 (two) times daily.     No current facility-administered medications for this visit.    Allergies:   Sulfa antibiotics    ROS:  Please see the history of  present illness.   Otherwise, review of systems are positive for none.   All other systems are reviewed and negative.    PHYSICAL EXAM: VS:  BP 137/78    Pulse 68    Temp (!) 94.1 F (34.5 C)    Ht 5\' 9"  (1.753 m)    Wt 237 lb (107.5 kg)    SpO2 95%    BMI 35.00 kg/m  , BMI Body mass index is 35 kg/m. GENERAL:  Well appearing NECK:  No jugular venous distention, waveform within normal limits, carotid upstroke brisk and symmetric, no bruits, no thyromegaly LUNGS:  Clear to auscultation bilaterally CHEST:  Unremarkable HEART:  PMI not displaced or sustained,S1 and S2 within normal limits, no S3, no S4, no clicks, no rubs, no murmurs ABD:  Flat, positive bowel sounds normal in frequency in pitch, no bruits, no rebound, no guarding, no midline pulsatile mass, no hepatomegaly, no splenomegaly EXT:  2 plus pulses throughout, no edema, no cyanosis no clubbing   EKG:  EKG is not  ordered today.    Recent Labs: 10/06/2019: Hemoglobin 16.5; Platelets 284 03/18/2020: BUN 24; Creatinine, Ser 1.12; Potassium 5.0; Sodium 137    Lipid Panel No results found for: CHOL, TRIG, HDL, CHOLHDL, VLDL, LDLCALC, LDLDIRECT  Wt Readings from Last 3 Encounters:  06/03/20 237 lb (107.5 kg)  03/01/20 244 lb (110.7 kg)  10/06/19 245 lb (111.1 kg)      Other studies Reviewed: Additional studies/ records that were reviewed today include: CT. Review of the above records demonstrates:  Please see elsewhere in the note.     ASSESSMENT AND PLAN:  CAD:    We had a long discussion about this.  He appears to have nonobstructive disease by the CTA.  I am going to perform a functional study with a POET (Plain Old Exercise Treadmill).  We are going to pursue aggressive risk reduction.  At this point he will continue the meds as listed.    DYSLIPIDEMIA: His LDL recently was 179.   He was just started on Praluent.  We can check a lipid profile in about 3 months.  HTN:    The blood pressure is well controlled.   No change in therapy.  DM: A1c was 6.5.  This was down from 7.1.  I will defer to his primary provider.  COVID EDUCATION:   He had Covid in the fall.  I encouraged him to get the vaccine.   Current medicines are reviewed at length with the patient today.  The patient does not have concerns regarding medicines.  The following changes have been made:  no change  Labs/ tests ordered today include:   Orders Placed This Encounter  Procedures   EXERCISE TOLERANCE TEST (ETT)     Disposition:   FU with me in  12 months   Signed, Minus Breeding, MD  06/03/2020 10:09 AM    [place]

## 2020-06-03 ENCOUNTER — Encounter: Payer: Self-pay | Admitting: Cardiology

## 2020-06-03 ENCOUNTER — Other Ambulatory Visit: Payer: Self-pay

## 2020-06-03 ENCOUNTER — Ambulatory Visit (INDEPENDENT_AMBULATORY_CARE_PROVIDER_SITE_OTHER): Payer: Medicare Other | Admitting: Cardiology

## 2020-06-03 VITALS — BP 137/78 | HR 68 | Temp 94.1°F | Ht 69.0 in | Wt 237.0 lb

## 2020-06-03 DIAGNOSIS — Z7189 Other specified counseling: Secondary | ICD-10-CM | POA: Diagnosis not present

## 2020-06-03 DIAGNOSIS — E785 Hyperlipidemia, unspecified: Secondary | ICD-10-CM | POA: Diagnosis not present

## 2020-06-03 DIAGNOSIS — E118 Type 2 diabetes mellitus with unspecified complications: Secondary | ICD-10-CM

## 2020-06-03 DIAGNOSIS — I2510 Atherosclerotic heart disease of native coronary artery without angina pectoris: Secondary | ICD-10-CM

## 2020-06-03 DIAGNOSIS — I10 Essential (primary) hypertension: Secondary | ICD-10-CM

## 2020-06-03 NOTE — Patient Instructions (Signed)
Medication Instructions:  The current medical regimen is effective;  continue present plan and medications.  *If you need a refill on your cardiac medications before your next appointment, please call your pharmacy*   Testing/Procedures: Your physician has requested that you have an exercise tolerance test, this is a screening tool to track your fitness level. This test evaluates the your exercise capacity by measuring cardiovascular response to exercise, the stress response is induced by exercise (exercise-treadmill).  Graded exercise test is also known as maximal exercise test or stress EKG test  . Please also follow instruction sheet given.   Follow-Up: At [facility], you and your health needs are our priority.  As part of our continuing mission to provide you with exceptional heart care, we have created designated Provider Care Teams.  These Care Teams include your primary Cardiologist (physician) and Advanced Practice Providers (APPs -  Physician Assistants and Nurse Practitioners) who all work together to provide you with the care you need, when you need it.  We recommend signing up for the patient portal called "MyChart".  Sign up information is provided on this After Visit Summary.  MyChart is used to connect with patients for Virtual Visits (Telemedicine).  Patients are able to view lab/test results, encounter notes, upcoming appointments, etc.  Non-urgent messages can be sent to your provider as well.   To learn more about what you can do with MyChart, go to [email or web address].    Your next appointment:   12 month(s)  The format for your next appointment:   In Person  Provider:   Minus Breeding, MD

## 2020-06-22 ENCOUNTER — Telehealth (HOSPITAL_COMMUNITY): Payer: Self-pay | Admitting: *Deleted

## 2020-06-22 NOTE — Telephone Encounter (Signed)
Close encounter 

## 2020-06-23 ENCOUNTER — Other Ambulatory Visit: Payer: Self-pay

## 2020-06-23 ENCOUNTER — Ambulatory Visit (HOSPITAL_COMMUNITY)
Admission: RE | Admit: 2020-06-23 | Discharge: 2020-06-23 | Disposition: A | Discharge status: Home / Self Care | Payer: Medicare Other | Source: Ambulatory Visit | Attending: Cardiovascular Disease | Admitting: Cardiovascular Disease

## 2020-06-23 DIAGNOSIS — I2510 Atherosclerotic heart disease of native coronary artery without angina pectoris: Secondary | ICD-10-CM

## 2020-06-23 LAB — EXERCISE TOLERANCE TEST
Estimated workload: 5.2 METS
Exercise duration (min): 3 min
Exercise duration (sec): 33 s
MPHR: 152 {beats}/min
Peak HR: 133 {beats}/min
Percent HR: 87 %
Rest HR: 69 {beats}/min

## 2020-06-24 ENCOUNTER — Telehealth: Payer: Self-pay | Admitting: Cardiology

## 2020-06-24 NOTE — Telephone Encounter (Signed)
Patient called in regarding patients Exercise Tolerance Test results. Patient was notified of the following message from Dr. Percival Spanish,  Minus Breeding, MD  06/23/2020 3:34 PM EDT     Negative POET (Plain Old Exercise Treadmill). Continue with risk reduction. No further testing at this time.Call Mr. Boran with the results and send results to London Pepper, MD   Patient verbalized understanding.

## 2020-06-24 NOTE — Telephone Encounter (Signed)
Patient is requesting to discuss results from Exercise Tolerance Test completed on 06/23/20. Please call.

## 2020-06-30 DIAGNOSIS — Z23 Encounter for immunization: Secondary | ICD-10-CM | POA: Diagnosis not present

## 2020-07-26 ENCOUNTER — Telehealth: Payer: Self-pay

## 2020-07-26 DIAGNOSIS — E785 Hyperlipidemia, unspecified: Secondary | ICD-10-CM

## 2020-07-26 NOTE — Telephone Encounter (Signed)
Called and spoke w/pt they stated that the praluent is going great and they wanted to complete lab work. Orders placed

## 2020-07-26 NOTE — Telephone Encounter (Signed)
-----   Message from Harrington Challenger, [place] sent at 07/26/2020  8:37 AM EDT ----- Please call patient when possible ----- Message ----- From: Harrington Challenger, RPH-CPP Sent: 07/25/2020 To: Raquel Rodriguez-Guzman, RPH-CPP  Repeat fasting lipid. On praluent??

## 2020-07-27 ENCOUNTER — Other Ambulatory Visit: Payer: Self-pay | Admitting: Pharmacist

## 2020-07-27 ENCOUNTER — Other Ambulatory Visit: Payer: Self-pay

## 2020-07-27 DIAGNOSIS — E785 Hyperlipidemia, unspecified: Secondary | ICD-10-CM

## 2020-07-27 LAB — LIPID PANEL
Chol/HDL Ratio: 5 ratio (ref 0.0–5.0)
Cholesterol, Total: 186 mg/dL (ref 100–199)
HDL: 37 mg/dL — ABNORMAL LOW (ref >39)
LDL Chol Calc (NIH): 114 mg/dL — ABNORMAL HIGH (ref 0–99)
Triglycerides: 198 mg/dL — ABNORMAL HIGH (ref 0–149)
VLDL Cholesterol Cal: 35 mg/dL (ref 5–40)

## 2020-07-27 MED ORDER — ROSUVASTATIN CALCIUM 10 MG PO TABS: 10.0000 mg | ORAL_TABLET | ORAL | 1 refills | Status: DC

## 2020-07-27 NOTE — Telephone Encounter (Signed)
Will add rosuvastatin 10mg  weekly to current Praluent regimen and repeat fasting blood work in 2 months

## 2020-09-01 DIAGNOSIS — E785 Hyperlipidemia, unspecified: Secondary | ICD-10-CM | POA: Diagnosis not present

## 2020-09-01 DIAGNOSIS — I10 Essential (primary) hypertension: Secondary | ICD-10-CM | POA: Diagnosis not present

## 2020-09-01 DIAGNOSIS — G4700 Insomnia, unspecified: Secondary | ICD-10-CM | POA: Diagnosis not present

## 2020-09-01 DIAGNOSIS — R5383 Other fatigue: Secondary | ICD-10-CM | POA: Diagnosis not present

## 2020-09-01 DIAGNOSIS — M79673 Pain in unspecified foot: Secondary | ICD-10-CM | POA: Diagnosis not present

## 2020-09-01 DIAGNOSIS — E1169 Type 2 diabetes mellitus with other specified complication: Secondary | ICD-10-CM | POA: Diagnosis not present

## 2020-09-26 ENCOUNTER — Telehealth: Payer: Self-pay

## 2020-09-26 DIAGNOSIS — E785 Hyperlipidemia, unspecified: Secondary | ICD-10-CM

## 2020-09-26 NOTE — Telephone Encounter (Signed)
-----   Message from Harrington Challenger, [place] sent at 09/26/2020  8:54 AM EST ----- Regarding: FW: Lipids Please call this patient   thanks ----- Message ----- From: Harrington Challenger, RPH-CPP Sent: 09/26/2020 To: Raquel Rodriguez-Guzman, RPH-CPP Subject: Lipids                                         Using praluent 150mg  every 14 days plus rosuvastatin once weekly?  Repeat fasting lipid panel 48 hours after next praluent dose

## 2020-09-26 NOTE — Telephone Encounter (Signed)
Called and instructed pt to come 48 hrs after next praluent dose pt stated taking praluent and rosuvastatin once weekly and tolerating it well. Orders placed. Pt voiced understanding

## 2020-10-07 ENCOUNTER — Other Ambulatory Visit: Payer: Self-pay

## 2020-10-07 DIAGNOSIS — E785 Hyperlipidemia, unspecified: Secondary | ICD-10-CM | POA: Diagnosis not present

## 2020-10-07 LAB — LIPID PANEL
Chol/HDL Ratio: 4 ratio (ref 0.0–5.0)
Cholesterol, Total: 182 mg/dL (ref 100–199)
HDL: 45 mg/dL (ref >39)
LDL Chol Calc (NIH): 95 mg/dL (ref 0–99)
Triglycerides: 250 mg/dL — ABNORMAL HIGH (ref 0–149)
VLDL Cholesterol Cal: 42 mg/dL — ABNORMAL HIGH (ref 5–40)

## 2020-10-07 LAB — HEPATIC FUNCTION PANEL
ALT: 22 IU/L (ref 0–44)
AST: 18 IU/L (ref 0–40)
Albumin: 4.7 g/dL (ref 3.8–4.8)
Alkaline Phosphatase: 47 IU/L (ref 44–121)
Bilirubin Total: 0.4 mg/dL (ref 0.0–1.2)
Bilirubin, Direct: 0.15 mg/dL (ref 0.00–0.40)
Total Protein: 7.3 g/dL (ref 6.0–8.5)

## 2020-10-10 ENCOUNTER — Telehealth: Payer: Self-pay | Admitting: Pharmacist

## 2020-10-10 MED ORDER — ROSUVASTATIN CALCIUM 10 MG PO TABS: 10.0000 mg | ORAL_TABLET | ORAL | 1 refills | Status: DC

## 2020-10-10 NOTE — Telephone Encounter (Signed)
LDL drown from 171--->95  Patient on Praluent every 14 days  plus rosuvastatin once weekly.  Will continue current therapy, continue low fat diet, and repeat fasting lipids in 3 months.

## 2020-12-02 DIAGNOSIS — I10 Essential (primary) hypertension: Secondary | ICD-10-CM | POA: Diagnosis not present

## 2020-12-02 DIAGNOSIS — E1140 Type 2 diabetes mellitus with diabetic neuropathy, unspecified: Secondary | ICD-10-CM | POA: Diagnosis not present

## 2020-12-02 DIAGNOSIS — E785 Hyperlipidemia, unspecified: Secondary | ICD-10-CM | POA: Diagnosis not present

## 2020-12-02 DIAGNOSIS — E1169 Type 2 diabetes mellitus with other specified complication: Secondary | ICD-10-CM | POA: Diagnosis not present

## 2020-12-02 DIAGNOSIS — G4700 Insomnia, unspecified: Secondary | ICD-10-CM | POA: Diagnosis not present

## 2021-01-30 ENCOUNTER — Telehealth: Payer: Self-pay

## 2021-01-30 DIAGNOSIS — E785 Hyperlipidemia, unspecified: Secondary | ICD-10-CM

## 2021-01-30 NOTE — Telephone Encounter (Signed)
Called and spoke w/pt regarding the praluent and rosuvastatin once weekly and he stated that it was going great and we discussed that he needs fasting lipid and hepatic asap and orders were placed. Pt voiced understanding

## 2021-01-30 NOTE — Telephone Encounter (Signed)
-----   Message from Harrington Challenger, [place] sent at 01/30/2021  7:34 AM EDT ----- Regarding: FW: Lipids Please call patient.  On Praluent every 14 days plus rosuvastatin once weekly? Due to repeat fasting blood work.  Thanks ----- Message ----- From: Harrington Challenger, RPH-CPP Sent: 01/30/2021  12:00 AM EDT To: Roxanne Mins Rodriguez-Guzman, RPH-CPP Subject: Lipids                                         Repeat fasting blood work in March

## 2021-01-30 NOTE — Addendum Note (Signed)
Addended by: Allean Found on: 01/30/2021 10:13 AM   Modules accepted: Orders

## 2021-01-31 DIAGNOSIS — E785 Hyperlipidemia, unspecified: Secondary | ICD-10-CM | POA: Diagnosis not present

## 2021-01-31 LAB — HEPATIC FUNCTION PANEL
ALT: 25 IU/L (ref 0–44)
AST: 16 IU/L (ref 0–40)
Albumin: 4.5 g/dL (ref 3.8–4.8)
Alkaline Phosphatase: 50 IU/L (ref 44–121)
Bilirubin Total: 0.4 mg/dL (ref 0.0–1.2)
Bilirubin, Direct: 0.13 mg/dL (ref 0.00–0.40)
Total Protein: 7.1 g/dL (ref 6.0–8.5)

## 2021-01-31 LAB — LIPID PANEL
Chol/HDL Ratio: 3.4 ratio (ref 0.0–5.0)
Cholesterol, Total: 137 mg/dL (ref 100–199)
HDL: 40 mg/dL (ref >39)
LDL Chol Calc (NIH): 59 mg/dL (ref 0–99)
Triglycerides: 240 mg/dL — ABNORMAL HIGH (ref 0–149)
VLDL Cholesterol Cal: 38 mg/dL (ref 5–40)

## 2021-03-07 ENCOUNTER — Other Ambulatory Visit: Payer: Self-pay | Admitting: Cardiology

## 2021-03-13 ENCOUNTER — Telehealth: Payer: Self-pay | Admitting: Cardiology

## 2021-03-13 ENCOUNTER — Other Ambulatory Visit: Payer: Self-pay | Admitting: Cardiology

## 2021-03-13 NOTE — Telephone Encounter (Signed)
Pt c/o medication issue:  1. Name of Medication: PRALUENT 150 MG/ML SOAJ  2. How are you currently taking this medication (dosage and times per day)? As directed  3. Are you having a reaction (difficulty breathing--STAT)? unsure  4. What is your medication issue? Patient sent message to scheduling to talk to Dr. Percival Spanish about coming off of [place]. Please advise. I sent him a message back asking if he was having any reactions but he has not responded as of right now.

## 2021-03-13 NOTE — Telephone Encounter (Signed)
Returned the call to the patient. He stated that he would like to discontinue the Praluent. He stated that every time that he takes the Praluent he gets cold\flu like symptoms. He also stated that it is too expensive.

## 2021-03-14 NOTE — Telephone Encounter (Signed)
Patient has been made aware and will discuss these options with Dr. Percival Spanish at his appointment in July. If he feels like he wants to stop the Praluent sooner then he will call back.

## 2021-03-14 NOTE — Telephone Encounter (Signed)
The cold like symptoms are a known side effect of Praluent but many times they can go away on their own.  If it is intolerable to patient then I agree, he should discontinue and next step would be Nexlizet.  If he is more concerned about the price, we can see if he qualifies for our grant program

## 2021-04-14 DIAGNOSIS — Z1211 Encounter for screening for malignant neoplasm of colon: Secondary | ICD-10-CM | POA: Diagnosis not present

## 2021-04-14 DIAGNOSIS — E1169 Type 2 diabetes mellitus with other specified complication: Secondary | ICD-10-CM | POA: Diagnosis not present

## 2021-04-14 DIAGNOSIS — E785 Hyperlipidemia, unspecified: Secondary | ICD-10-CM | POA: Diagnosis not present

## 2021-04-14 DIAGNOSIS — G629 Polyneuropathy, unspecified: Secondary | ICD-10-CM | POA: Diagnosis not present

## 2021-04-14 DIAGNOSIS — Z0000 Encounter for general adult medical examination without abnormal findings: Secondary | ICD-10-CM | POA: Diagnosis not present

## 2021-04-14 DIAGNOSIS — I700 Atherosclerosis of aorta: Secondary | ICD-10-CM | POA: Diagnosis not present

## 2021-04-14 DIAGNOSIS — I10 Essential (primary) hypertension: Secondary | ICD-10-CM | POA: Diagnosis not present

## 2021-04-14 DIAGNOSIS — Z125 Encounter for screening for malignant neoplasm of prostate: Secondary | ICD-10-CM | POA: Diagnosis not present

## 2021-05-23 DIAGNOSIS — N411 Chronic prostatitis: Secondary | ICD-10-CM | POA: Diagnosis not present

## 2021-05-23 DIAGNOSIS — R972 Elevated prostate specific antigen [PSA]: Secondary | ICD-10-CM | POA: Diagnosis not present

## 2021-05-23 DIAGNOSIS — N5201 Erectile dysfunction due to arterial insufficiency: Secondary | ICD-10-CM | POA: Diagnosis not present

## 2021-05-23 DIAGNOSIS — N401 Enlarged prostate with lower urinary tract symptoms: Secondary | ICD-10-CM | POA: Diagnosis not present

## 2021-05-23 DIAGNOSIS — R3915 Urgency of urination: Secondary | ICD-10-CM | POA: Diagnosis not present

## 2021-05-24 NOTE — Progress Notes (Signed)
Cardiology Office Note   Date:  05/25/2021   ID:  Benjamin Erickson, DOB [birth year], MRN [id number]  PCP:  London Pepper, MD  Cardiologist:   Minus Breeding, MD Referring:  London Pepper, MD  Chief Complaint  Patient presents with   Coronary Artery Disease       History of Present Illness: Benjamin Erickson is a 70 y.o. male who presents for evaluation of chest discomfort.   He had a CT with a calcium score of 1805.    He had diffuse non obstructive plaque as described below.  Unfortunately, FFR not able to be performed secondary to artifact. He had a negative POET (Plain Old Exercise Treadmill).    Since I last saw him he has had no new problems.   The patient denies any new symptoms such as chest discomfort, neck or arm discomfort. There has been no new shortness of breath, PND or orthopnea. There have been no reported palpitations, presyncope or syncope.   He does some yard work and gets no symptoms related to this.  He pushed a self-propelled mower recently.  Past Medical History:  Diagnosis Date   Bigeminy    DM (diabetes mellitus) ([place])    Frequent PVCs    Hyperlipidemia    Hypertension    Nocturia     Past Surgical History:  Procedure Laterality Date   HERNIA REPAIR     Inguinal      Current Outpatient Medications  Medication Sig Dispense Refill   acetaminophen (TYLENOL) 325 MG tablet Take 650 mg by mouth every 6 (six) hours as needed.     aspirin EC 81 MG tablet Take 81 mg by mouth daily. Swallow whole.     lisinopril-hydrochlorothiazide (PRINZIDE,ZESTORETIC) 10-12.5 MG tablet Take 1 tablet by mouth daily.     metFORMIN (GLUCOPHAGE) 500 MG tablet Take 500 mg by mouth 2 (two) times daily.     PRALUENT 150 MG/ML SOAJ INJECT 150 MG INTO THE SKIN EVERY 14 (FOURTEEN) DAYS. 2 mL 11   rosuvastatin (CRESTOR) 10 MG tablet TAKE 1 TABLET (10 MG TOTAL) BY MOUTH ONCE A WEEK. 12 tablet 1   tamsulosin (FLOMAX) 0.4 MG CAPS capsule Take 0.4 mg by mouth daily.     No current  facility-administered medications for this visit.    Allergies:   Sulfa antibiotics    ROS:  Please see the history of present illness.   Otherwise, review of systems are positive for none.   All other systems are reviewed and negative.    PHYSICAL EXAM: VS:  BP 122/82   Pulse 61   Ht 5\' 8"  (1.727 m)   Wt 228 lb 3.2 oz (103.5 kg)   SpO2 93%   BMI 34.70 kg/m  , BMI Body mass index is 34.7 kg/m. GENERAL:  Well appearing NECK:  No jugular venous distention, waveform within normal limits, carotid upstroke brisk and symmetric, no bruits, no thyromegaly LUNGS:  Clear to auscultation bilaterally CHEST:  Unremarkable HEART:  PMI not displaced or sustained,S1 and S2 within normal limits, no S3, no S4, no clicks, no rubs, no murmurs ABD:  Flat, positive bowel sounds normal in frequency in pitch, no bruits, no rebound, no guarding, no midline pulsatile mass, no hepatomegaly, no splenomegaly EXT:  2 plus pulses throughout, no edema, no cyanosis no clubbing   EKG:  EKG is   ordered today. Sinus rhythm, rate 61, left axis deviation, interventricular conduction delay, incomplete left bundle branch block, no change.  Recent Labs: 01/31/2021: ALT 25    Lipid Panel    Component Value Date/Time   CHOL 137 01/31/2021 0827   TRIG 240 (H) 01/31/2021 0827   HDL 40 01/31/2021 0827   CHOLHDL 3.4 01/31/2021 0827   LDLCALC 59 01/31/2021 0827      Wt Readings from Last 3 Encounters:  05/25/21 228 lb 3.2 oz (103.5 kg)  06/03/20 237 lb (107.5 kg)  03/01/20 244 lb (110.7 kg)      Other studies Reviewed: Additional studies/ records that were reviewed today include:   Labs Review of the above records demonstrates:  Please see elsewhere in the note.     ASSESSMENT AND PLAN:  CAD:    He has nonobstructive disease and no symptoms.  We are pursuing aggressive risk reduction.  No change in therapy.    DYSLIPIDEMIA: His LDL recently was down to 40 from 179.  No change in therapy.    HTN:     The blood pressure is at target.  No change in therapy.    DM: A1c was 6.1 in coming down from previous 7.1.  He has lost weight and is following a diet.  He is on good medical therapy.  No change in therapy.     Current medicines are reviewed at length with the patient today.  The patient does not have concerns regarding medicines.  The following changes have been made:  None  Labs/ tests ordered today include: None  Orders Placed This Encounter  Procedures   EKG 12-Lead      Disposition:   FU with me in 24 months   Signed, Minus Breeding, MD  05/25/2021 12:22 PM    [facility]

## 2021-05-25 ENCOUNTER — Encounter: Payer: Self-pay | Admitting: Cardiology

## 2021-05-25 ENCOUNTER — Other Ambulatory Visit: Payer: Self-pay

## 2021-05-25 ENCOUNTER — Ambulatory Visit (INDEPENDENT_AMBULATORY_CARE_PROVIDER_SITE_OTHER): Payer: Medicare Other | Admitting: Cardiology

## 2021-05-25 VITALS — BP 122/82 | HR 61 | Ht 68.0 in | Wt 228.2 lb

## 2021-05-25 DIAGNOSIS — I10 Essential (primary) hypertension: Secondary | ICD-10-CM | POA: Diagnosis not present

## 2021-05-25 DIAGNOSIS — I2510 Atherosclerotic heart disease of native coronary artery without angina pectoris: Secondary | ICD-10-CM | POA: Diagnosis not present

## 2021-05-25 DIAGNOSIS — E118 Type 2 diabetes mellitus with unspecified complications: Secondary | ICD-10-CM | POA: Diagnosis not present

## 2021-05-25 DIAGNOSIS — E785 Hyperlipidemia, unspecified: Secondary | ICD-10-CM

## 2021-05-25 NOTE — Patient Instructions (Signed)
Your physician recommends that you continue on your current medications as directed. Please refer to the Current Medication list given to you today.  *If you need a refill on your cardiac medications before your next appointment, please call your pharmacy*  Lab Work: NONE     Testing/Procedures: NONE   Follow-Up: At [facility], you and your health needs are our priority.  As part of our continuing mission to provide you with exceptional heart care, we have created designated Provider Care Teams.  These Care Teams include your primary Cardiologist (physician) and Advanced Practice Providers (APPs -  Physician Assistants and Nurse Practitioners) who all work together to provide you with the care you need, when you need it.  We recommend signing up for the patient portal called "MyChart".  Sign up information is provided on this After Visit Summary.  MyChart is used to connect with patients for Virtual Visits (Telemedicine).  Patients are able to view lab/test results, encounter notes, upcoming appointments, etc.  Non-urgent messages can be sent to your provider as well.   To learn more about what you can do with MyChart, go to [email or web address].    Your next appointment:   2 year(s)  The format for your next appointment:   In Person  Provider:   You may see Minus Breeding, MD  or one of the following Advanced Practice Providers on your designated Care Team:   Rosaria Ferries, PA-C Jory Sims, DNP, ANP

## 2021-08-17 DIAGNOSIS — I10 Essential (primary) hypertension: Secondary | ICD-10-CM | POA: Diagnosis not present

## 2021-08-17 DIAGNOSIS — E785 Hyperlipidemia, unspecified: Secondary | ICD-10-CM | POA: Diagnosis not present

## 2021-08-17 DIAGNOSIS — E1140 Type 2 diabetes mellitus with diabetic neuropathy, unspecified: Secondary | ICD-10-CM | POA: Diagnosis not present

## 2021-08-17 DIAGNOSIS — H9190 Unspecified hearing loss, unspecified ear: Secondary | ICD-10-CM | POA: Diagnosis not present

## 2021-08-17 DIAGNOSIS — G4700 Insomnia, unspecified: Secondary | ICD-10-CM | POA: Diagnosis not present

## 2021-08-17 DIAGNOSIS — E1169 Type 2 diabetes mellitus with other specified complication: Secondary | ICD-10-CM | POA: Diagnosis not present

## 2021-08-17 DIAGNOSIS — Z1211 Encounter for screening for malignant neoplasm of colon: Secondary | ICD-10-CM | POA: Diagnosis not present

## 2021-08-25 ENCOUNTER — Telehealth: Payer: Self-pay

## 2021-08-25 NOTE — Telephone Encounter (Signed)
Please call pt in regards to his Praluent and insurance coverage.  Thank you

## 2021-08-28 NOTE — Telephone Encounter (Signed)
Returned a call to the pt and they stated that they are thinking about changing insurances to [employer]. And they inquired about if praluent will be covered under health [employer] advantage. I advised that is should be but we will not be positive until we run the pa.

## 2021-08-28 NOTE — Telephone Encounter (Signed)
done

## 2021-09-20 NOTE — Telephone Encounter (Signed)
Called the pt to see if they obtained new insurance information lmom

## 2021-11-07 ENCOUNTER — Other Ambulatory Visit: Payer: Self-pay | Admitting: Cardiology

## 2021-12-05 DIAGNOSIS — H903 Sensorineural hearing loss, bilateral: Secondary | ICD-10-CM | POA: Diagnosis not present

## 2021-12-07 ENCOUNTER — Other Ambulatory Visit: Payer: Self-pay | Admitting: Otolaryngology

## 2021-12-07 DIAGNOSIS — H918X9 Other specified hearing loss, unspecified ear: Secondary | ICD-10-CM

## 2021-12-19 DIAGNOSIS — E1140 Type 2 diabetes mellitus with diabetic neuropathy, unspecified: Secondary | ICD-10-CM | POA: Diagnosis not present

## 2021-12-19 DIAGNOSIS — E785 Hyperlipidemia, unspecified: Secondary | ICD-10-CM | POA: Diagnosis not present

## 2021-12-19 DIAGNOSIS — I10 Essential (primary) hypertension: Secondary | ICD-10-CM | POA: Diagnosis not present

## 2021-12-19 DIAGNOSIS — H9190 Unspecified hearing loss, unspecified ear: Secondary | ICD-10-CM | POA: Diagnosis not present

## 2021-12-19 DIAGNOSIS — Z7984 Long term (current) use of oral hypoglycemic drugs: Secondary | ICD-10-CM | POA: Diagnosis not present

## 2021-12-19 DIAGNOSIS — E1169 Type 2 diabetes mellitus with other specified complication: Secondary | ICD-10-CM | POA: Diagnosis not present

## 2021-12-21 ENCOUNTER — Other Ambulatory Visit: Payer: Self-pay | Admitting: Cardiology

## 2022-01-03 ENCOUNTER — Ambulatory Visit
Admission: RE | Admit: 2022-01-03 | Discharge: 2022-01-03 | Disposition: A | Discharge status: Home / Self Care | Payer: PPO | Source: Ambulatory Visit | Attending: Otolaryngology | Admitting: Otolaryngology

## 2022-01-03 ENCOUNTER — Other Ambulatory Visit: Payer: Self-pay

## 2022-01-03 DIAGNOSIS — J341 Cyst and mucocele of nose and nasal sinus: Secondary | ICD-10-CM | POA: Diagnosis not present

## 2022-01-03 DIAGNOSIS — H918X9 Other specified hearing loss, unspecified ear: Secondary | ICD-10-CM

## 2022-01-03 DIAGNOSIS — J329 Chronic sinusitis, unspecified: Secondary | ICD-10-CM | POA: Diagnosis not present

## 2022-01-03 DIAGNOSIS — I6782 Cerebral ischemia: Secondary | ICD-10-CM | POA: Diagnosis not present

## 2022-01-03 DIAGNOSIS — H9192 Unspecified hearing loss, left ear: Secondary | ICD-10-CM | POA: Diagnosis not present

## 2022-01-03 MED ORDER — GADOBENATE DIMEGLUMINE 529 MG/ML IV SOLN
20.0000 mL | Freq: Once | INTRAVENOUS | Status: AC | PRN
  Administered 2022-01-03: 20 mL via INTRAVENOUS

## 2022-01-22 ENCOUNTER — Other Ambulatory Visit: Payer: Self-pay | Admitting: Cardiology

## 2022-02-20 DIAGNOSIS — R972 Elevated prostate specific antigen [PSA]: Secondary | ICD-10-CM | POA: Diagnosis not present

## 2022-02-22 DIAGNOSIS — R972 Elevated prostate specific antigen [PSA]: Secondary | ICD-10-CM | POA: Diagnosis not present

## 2022-02-25 IMAGING — MR MR BRAIN/TEMPORAL BONE/IAC
11 of 12 series · 37 of 48 positions shown · IV contrast (multihance)
Comparison: None

CLINICAL DATA: Asymmetrical hearing loss. Additional history
provided by scanning technologist: Patient reports left-sided
hearing loss for 2 months.

EXAM:
MRI HEAD WITHOUT AND WITH CONTRAST
TECHNIQUE: Multiplanar, multiecho pulse sequences of the brain and surrounding
structures were obtained without and with intravenous contrast.
CONTRAST:  20mL MULTIHANCE GADOBENATE DIMEGLUMINE 529 MG/ML IV SOLN

[Series 2: T1 · sagittal · 5.0mm · 0.49mm/px · 3 of 21 slices shown (1 of 4)]
[im 1/21]
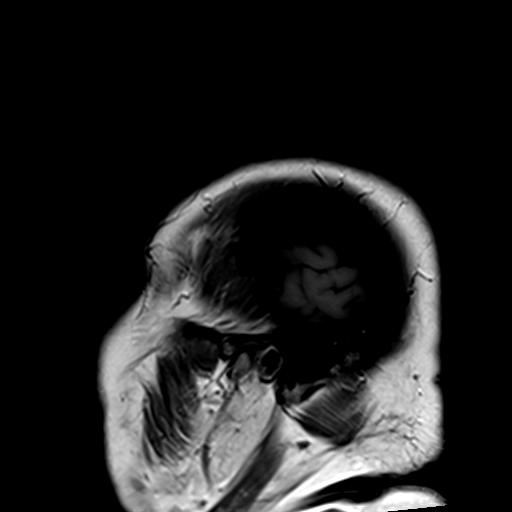
[im 11/21]
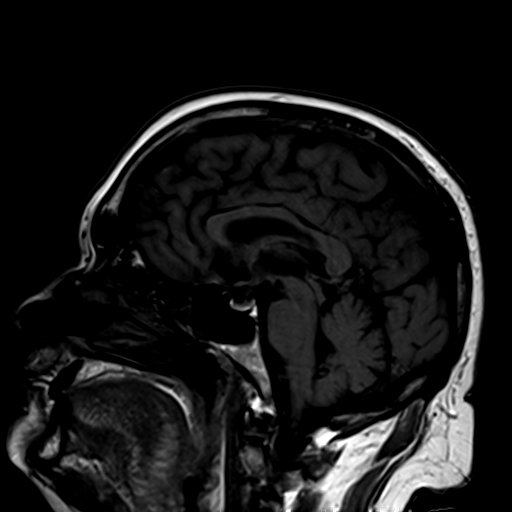
[im 21/21]
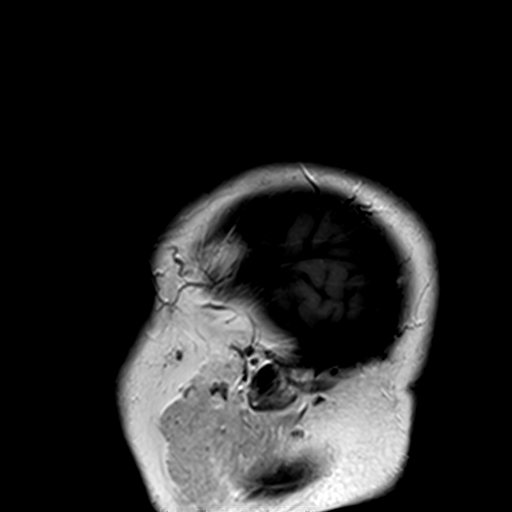

[Series 3: ep2d_diff_3 · axial · 3.0mm · 1.95mm/px · z∈[-82,+64]mm · 8 of 99 slices shown]
[im 1/99]
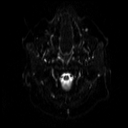
[im 11/99]
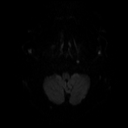
[im 33/99]
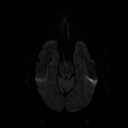
[im 44/99]
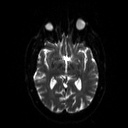
[im 55/99]
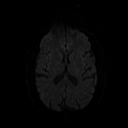
[im 66/99]
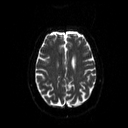
[im 88/99]
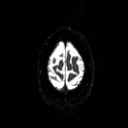
[im 99/99]
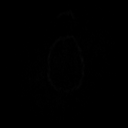

[Series 4: ep2d_diff_3_adc · axial · 3.0mm · 1.95mm/px · z∈[-82,+64]mm · 5 of 50 slices shown]
[im 1/50]
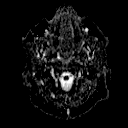
[im 13/50]
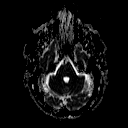
[im 25/50]
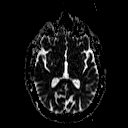
[im 37/50]
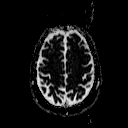
[im 50/50]
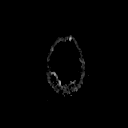

[Series 5: t2_tse_tra_512 · axial · 5.0mm · 0.65mm/px · z∈[-79,+63]mm · 2 of 24 slices shown]
[im 1/24]
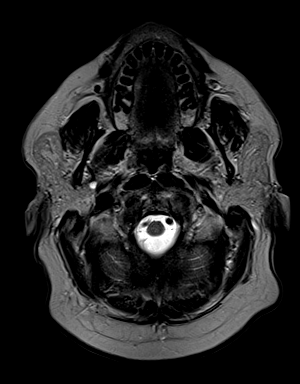
[im 24/24]
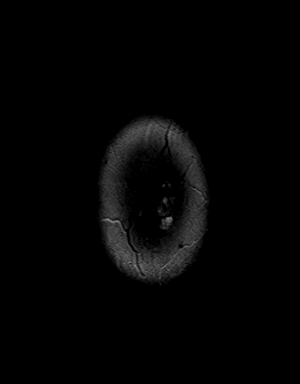

[Series 6: FLAIR · axial · 3.0mm · 0.49mm/px · z∈[-86,+68]mm · 3 of 27 slices shown]
[im 1/27]
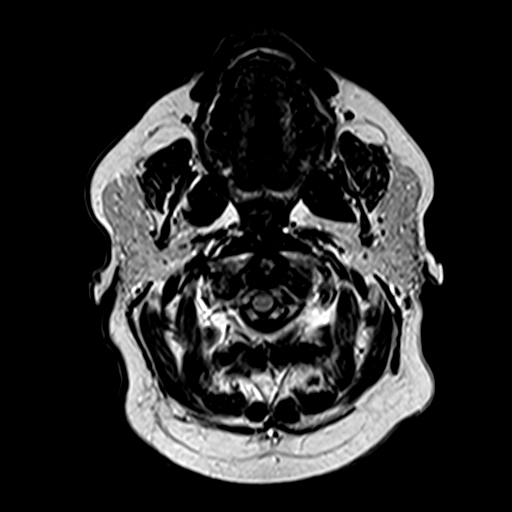
[im 14/27]
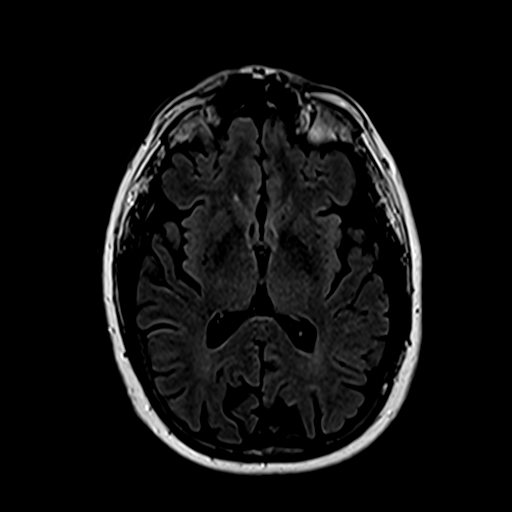
[im 27/27]
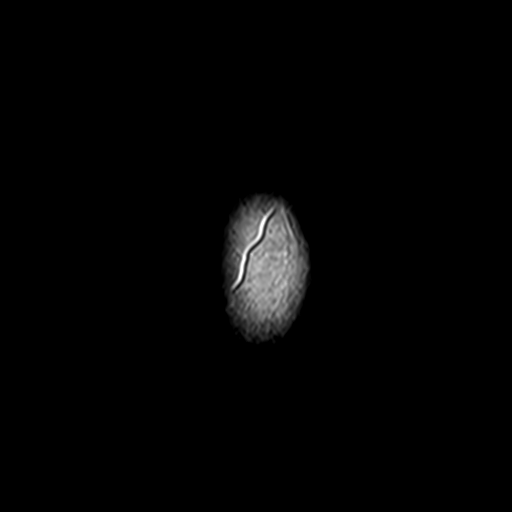

[Series 8: swi_images · axial · 2.0mm · 0.98mm/px · z∈[-86,+70]mm · 8 of 80 slices shown]
[im 1/80]
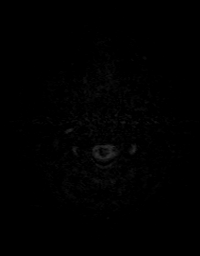
[im 12/80]
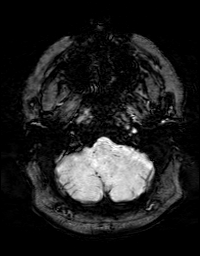
[im 23/80]
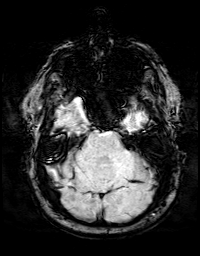
[im 34/80]
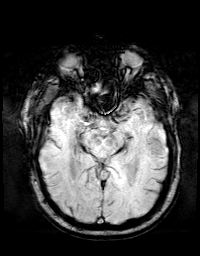
[im 46/80]
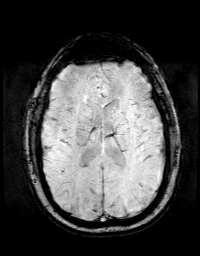
[im 57/80]
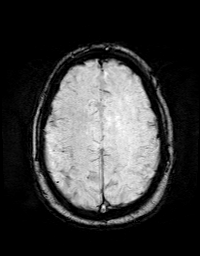
[im 68/80]
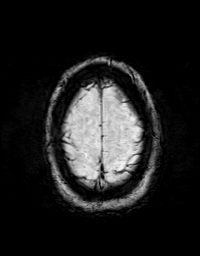
[im 80/80]
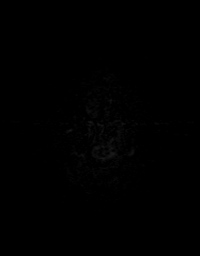

[Series 9: T1 · coronal · 3.0mm · 0.35mm/px · 1 of 11 slices shown (2 of 4)]
[im 1/11]
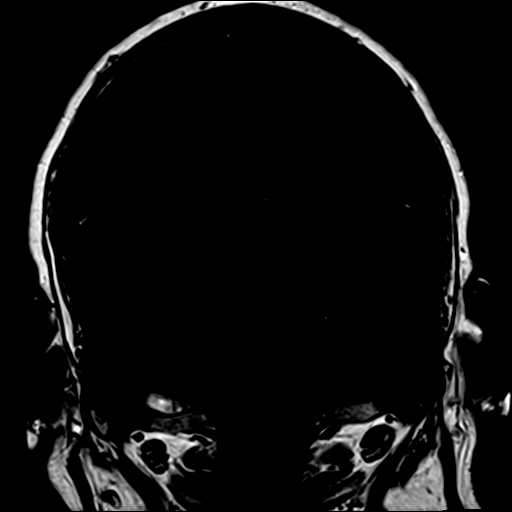

[Series 10: T1 · axial · 3.0mm · 0.35mm/px · 1 of 11 slices shown (3 of 4)]
[im 1/11]
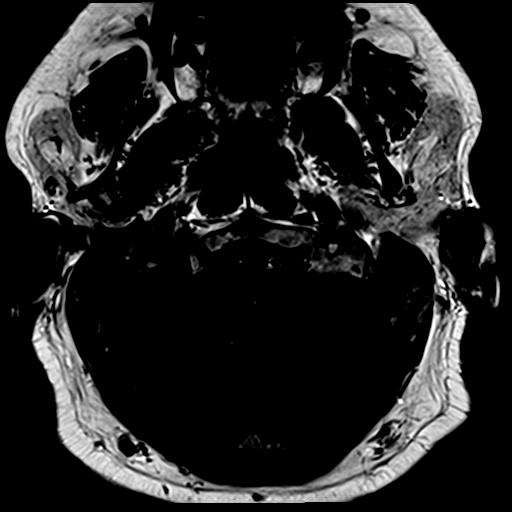

[Series 11: bSSFP · axial · 0.7mm · 0.28mm/px · z∈[-86,-64]mm · 4 of 44 slices shown]
[im 1/44]
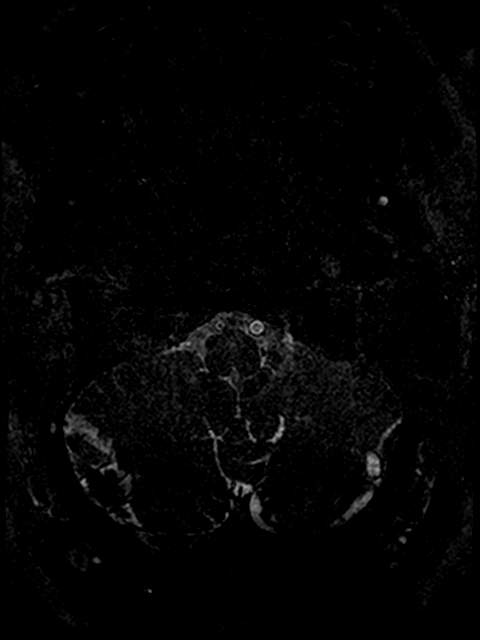
[im 11/44]
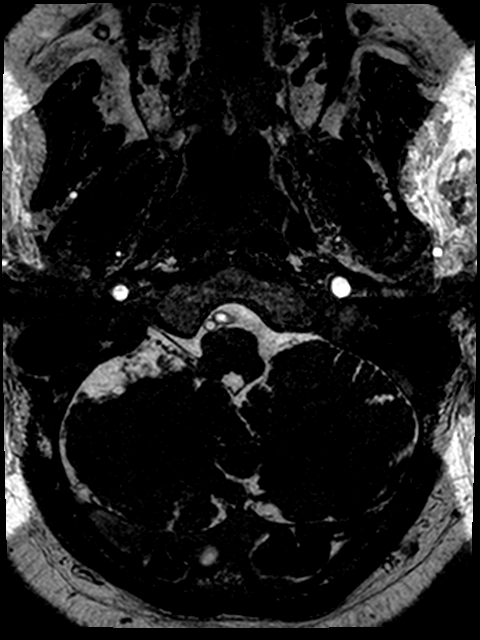
[im 22/44]
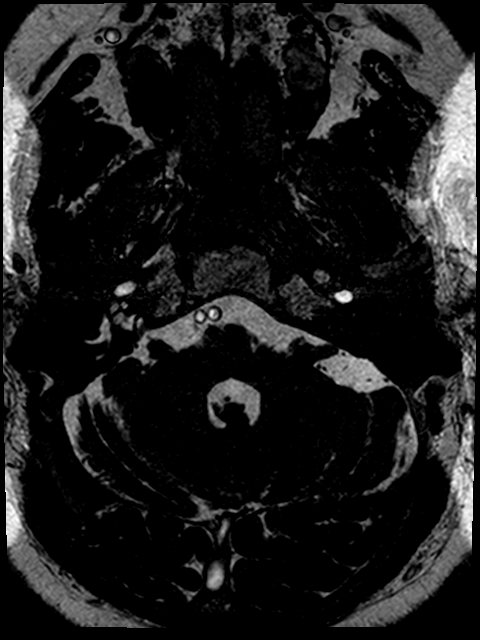
[im 33/44]
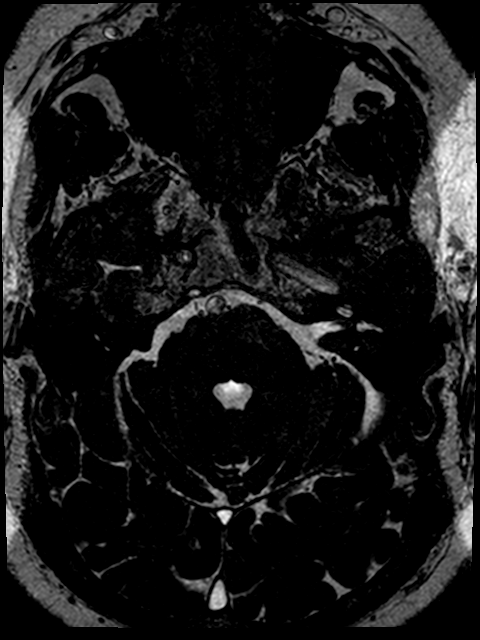

[Series 12: T1 · coronal · 3.0mm · 0.35mm/px · 1 of 11 slices shown (4 of 4)]
[im 1/11]
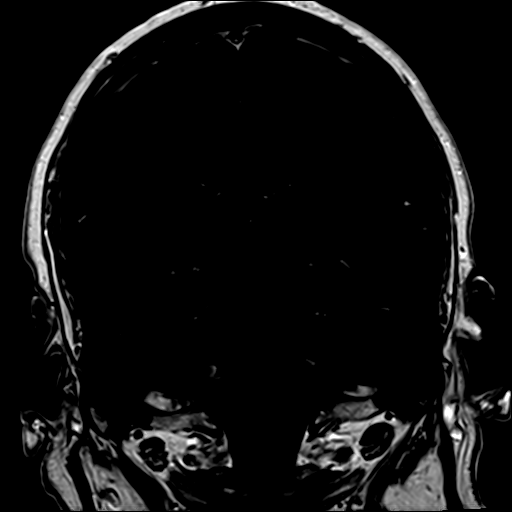

[Series 13: T1 post-contrast · axial · 3.0mm · 0.35mm/px · 1 of 11 slices shown]
[im 1/11]
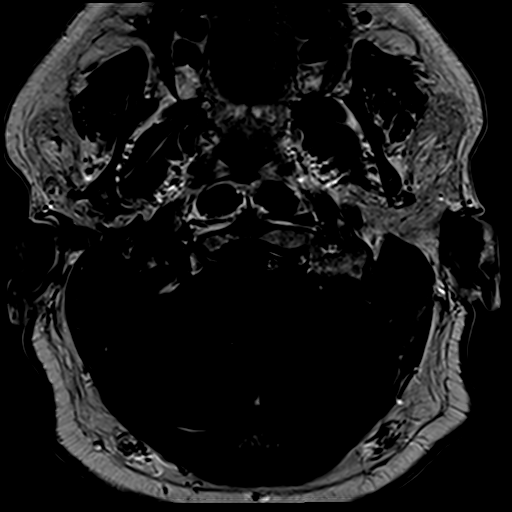

[37 of 48 positions shown; findings below may reference images not displayed]

FINDINGS: Brain:

Cerebral volume appears normal for age.

Mild multifocal T2 FLAIR hyperintense signal abnormality within the
cerebral white matter, nonspecific but compatible with chronic small
vessel ischemic disease.

No evidence of an intracranial mass. Specifically, no
cerebellopontine angle or internal auditory canal mass is
demonstrated. Unremarkable appearance of the 7th and 8th cranial
nerves bilaterally.

There is no acute infarct.

No chronic intracranial blood products.

No extra-axial fluid collection.

No midline shift.

No pathologic intracranial enhancement identified.

Vascular: Maintained flow voids within the proximal large arterial
vessels.

Skull and upper cervical spine: No focal suspicious marrow lesion.

Sinuses/Orbits: Visualized orbits show no acute finding. Minimal
mucosal thickening within the bilateral ethmoid sinuses.
Small-volume fluid within a posterior right ethmoid air cell. Small
mucous retention cysts within the inferior left maxillary sinus.
IMPRESSION: No evidence of acute intracranial abnormality.

No cerebellopontine angle or internal auditory canal mass.

Mild chronic small vessel ischemic changes within the cerebral white
matter.

Paranasal sinus disease, as described.

## 2022-04-12 ENCOUNTER — Other Ambulatory Visit: Payer: Self-pay | Admitting: Cardiology

## 2022-05-04 ENCOUNTER — Other Ambulatory Visit: Payer: Self-pay | Admitting: Cardiology

## 2022-06-03 ENCOUNTER — Other Ambulatory Visit: Payer: Self-pay | Admitting: Cardiology

## 2022-06-05 MED ORDER — ROSUVASTATIN CALCIUM 10 MG PO TABS: ORAL_TABLET | ORAL | 1 refills | Status: DC

## 2022-06-05 NOTE — Addendum Note (Signed)
Addended by: Cristopher Estimable on: 06/05/2022 04:07 PM   Modules accepted: Orders

## 2022-07-10 DIAGNOSIS — R0602 Shortness of breath: Secondary | ICD-10-CM | POA: Diagnosis not present

## 2022-07-15 NOTE — Progress Notes (Unsigned)
Cardiology Office Note   Date:  07/16/2022   ID:  Benjamin Erickson, DOB 17-Aug-1951, MRN [id number]  PCP:  London Pepper, MD  Cardiologist:   Minus Breeding, MD Referring:  London Pepper, MD  Chief Complaint  Patient presents with   Shortness of Breath       History of Present Illness: Benjamin Erickson is a 71 y.o. male who presents for evaluation of chest discomfort.   He had a CT with a calcium score of 1805.    He had diffuse non obstructive plaque as described below.  Unfortunately, FFR not able to be performed secondary to artifact. He had a negative POET (Plain Old Exercise Treadmill) in 2021.    Since I last saw him he has had some slightly progressive shortness of breath.  He does activities like walks behind a self-propelled mower but is not exercising as much as I would like.  He is not watching his diet is much as I would like.  Several weeks ago at night he had some chest discomfort that he thought was GI.  He is never had this since.  He does not get the chest discomfort when he is walking behind the mower or doing his usual activities.  He is not having any resting shortness of breath, PND or orthopnea.  He had no new palpitations, presyncope or syncope.  He thinks that his shortness of breath is just slowly progressive.   Past Medical History:  Diagnosis Date   Bigeminy    DM (diabetes mellitus) ([place])    Frequent PVCs    Hyperlipidemia    Hypertension    Nocturia     Past Surgical History:  Procedure Laterality Date   HERNIA REPAIR     Inguinal      Current Outpatient Medications  Medication Sig Dispense Refill   acetaminophen (TYLENOL) 325 MG tablet Take 650 mg by mouth every 6 (six) hours as needed.     aspirin EC 81 MG tablet Take 81 mg by mouth daily. Swallow whole.     lisinopril-hydrochlorothiazide (PRINZIDE,ZESTORETIC) 10-12.5 MG tablet Take 1 tablet by mouth daily.     metFORMIN (GLUCOPHAGE) 500 MG tablet Take 500 mg by mouth 2 (two) times  daily.     PRALUENT 150 MG/ML SOAJ INJECT 150 MG INTO THE SKIN EVERY 14 (FOURTEEN) DAYS. 6 mL 3   rosuvastatin (CRESTOR) 10 MG tablet TAKE 1 TABLET BY MOUTH ONCE A WEEK. 90 tablet 1   tamsulosin (FLOMAX) 0.4 MG CAPS capsule Take 0.4 mg by mouth daily.     No current facility-administered medications for this visit.    Allergies:   Sulfa antibiotics    ROS:  Please see the history of present illness.   Otherwise, review of systems are positive for none.   All other systems are reviewed and negative.    PHYSICAL EXAM: VS:  BP 133/72   Pulse 84   Ht '5\' 10"'$  (1.778 m)   Wt 236 lb (107 kg)   SpO2 96%   BMI 33.86 kg/m  , BMI Body mass index is 33.86 kg/m. GENERAL:  Well appearing NECK:  No jugular venous distention, waveform within normal limits, carotid upstroke brisk and symmetric, no bruits, no thyromegaly LUNGS:  Clear to auscultation bilaterally CHEST:  Unremarkable HEART:  PMI not displaced or sustained,S1 and S2 within normal limits, no S3, no S4, no clicks, no rubs, no murmurs ABD:  Flat, positive bowel sounds normal in frequency in  pitch, no bruits, no rebound, no guarding, no midline pulsatile mass, no hepatomegaly, no splenomegaly EXT:  2 plus pulses throughout, no edema, no cyanosis no clubbing  EKG:  EKG is  ordered today. Sinus rhythm, rate 84, left axis deviation, interventricular conduction delay, incomplete left bundle branch block, no change.  Premature ectopic complexes   Recent Labs: No results found for requested labs within last 365 days.    Lipid Panel    Component Value Date/Time   CHOL 137 01/31/2021 0827   TRIG 240 (H) 01/31/2021 0827   HDL 40 01/31/2021 0827   CHOLHDL 3.4 01/31/2021 0827   LDLCALC 59 01/31/2021 0827      Wt Readings from Last 3 Encounters:  07/16/22 236 lb (107 kg)  05/25/21 228 lb 3.2 oz (103.5 kg)  06/03/20 237 lb (107.5 kg)      Other studies Reviewed: Additional studies/ records that were reviewed today include:    Labs Review of the above records demonstrates:  Please see elsewhere in the note.     ASSESSMENT AND PLAN:  CAD:   He has some progressive shortness of breath.  Has nonobstructive coronary disease.  Unfortunately he is at high risk for future cardiovascular events because he has not participated in risk reduction in terms of lifestyle.  He and I have this conversation.  I would like to screen him because of the increasing shortness of breath with PET scanning.   DYSLIPIDEMIA: His LDL was 40 last year and he is overdue for follow-up.  I will obtain fasting lipid profile and an LP(a).   HTN:    The blood pressure is at target and I gave him the goal of systolics in the 122Q.   DM: A1c was 6.3.  We talked about Mediterranean plant-based diet. Continue current therapy although at future appointments we should talk about SGLT2 or GLP-1 receptor agonist  Current medicines are reviewed at length with the patient today.  The patient does not have concerns regarding medicines.  The following changes have been made:  None  Labs/ tests ordered today include: None  Orders Placed This Encounter  Procedures   NM PET CT CARDIAC PERFUSION MULTI W/ABSOLUTE BLOODFLOW   Lipid panel   Hepatic function panel   Lipoprotein A (LPA)   EKG 12-Lead     Disposition:   FU with APP in 3 months  Signed, Minus Breeding, MD  07/16/2022 9:16 AM    [place]

## 2022-07-16 ENCOUNTER — Encounter: Payer: Self-pay | Admitting: Cardiology

## 2022-07-16 ENCOUNTER — Ambulatory Visit: Payer: PPO | Attending: Cardiology | Admitting: Cardiology

## 2022-07-16 VITALS — BP 133/72 | HR 84 | Ht 70.0 in | Wt 236.0 lb

## 2022-07-16 DIAGNOSIS — Z79899 Other long term (current) drug therapy: Secondary | ICD-10-CM | POA: Diagnosis not present

## 2022-07-16 DIAGNOSIS — I10 Essential (primary) hypertension: Secondary | ICD-10-CM | POA: Diagnosis not present

## 2022-07-16 DIAGNOSIS — E118 Type 2 diabetes mellitus with unspecified complications: Secondary | ICD-10-CM

## 2022-07-16 DIAGNOSIS — E785 Hyperlipidemia, unspecified: Secondary | ICD-10-CM | POA: Diagnosis not present

## 2022-07-16 DIAGNOSIS — I2510 Atherosclerotic heart disease of native coronary artery without angina pectoris: Secondary | ICD-10-CM | POA: Diagnosis not present

## 2022-07-16 NOTE — Patient Instructions (Signed)
Medication Instructions:  Your Physician recommend you continue on your current medication as directed.    *If you need a refill on your cardiac medications before your next appointment, please call your pharmacy*   Lab Work: Your physician recommends that you return for lab work (fasting lipid, LFT, LPa)   If you have labs (blood work) drawn today and your tests are completely normal, you will receive your results only by: [place] (if you have MyChart) OR A paper copy in the mail If you have any lab test that is abnormal or we need to change your treatment, we will call you to review the results.   Testing/Procedures: Cardiac PET Scan   Follow-Up: At [facility], you and your health needs are our priority.  As part of our continuing mission to provide you with exceptional heart care, we have created designated Provider Care Teams.  These Care Teams include your primary Cardiologist (physician) and Advanced Practice Providers (APPs -  Physician Assistants and Nurse Practitioners) who all work together to provide you with the care you need, when you need it.  We recommend signing up for the patient portal called "MyChart".  Sign up information is provided on this After Visit Summary.  MyChart is used to connect with patients for Virtual Visits (Telemedicine).  Patients are able to view lab/test results, encounter notes, upcoming appointments, etc.  Non-urgent messages can be sent to your provider as well.   To learn more about what you can do with MyChart, go to [email or web address].    Your next appointment:   3 month(s)  The format for your next appointment:   In Person  Provider:   Almyra Deforest, PA-C        How to Prepare for Your Cardiac PET/CT Stress Test:  1. Please do not take these medications before your test:   Medications that may interfere with the cardiac pharmacological stress agent (ex. nitrates - including erectile dysfunction medications or  beta-blockers) the day of the exam. (Erectile dysfunction medication should be held for at least 72 hrs prior to test) Theophylline containing medications for 12 hours. Dipyridamole 48 hours prior to the test. Your remaining medications may be taken with water.  2. Nothing to eat or drink, except water, 3 hours prior to arrival time.   NO caffeine/decaffeinated products, or chocolate 12 hours prior to arrival.  3. NO perfume, cologne or lotion  4. Total time is 1 to 2 hours; you may want to bring reading material for the waiting time.  5. Please report to Admitting at the [facility] Main Entrance 60 minutes early for your test.  [address] [place], [place] [address]  Diabetic Preparation:  Hold oral medications (Metformin) morning of procedure You may take NPH and Lantus insulin. Do not take Humalog or Humulin R (Regular Insulin) the day of your test. Check blood sugars prior to leaving the house. If able to eat breakfast prior to 3 hour fasting, you may take all medications, including your insulin, Do not worry if you miss your breakfast dose of insulin - start at your next meal.  IF YOU THINK YOU MAY BE PREGNANT, OR ARE NURSING PLEASE INFORM THE TECHNOLOGIST.  In preparation for your appointment, medication and supplies will be purchased.  Appointment availability is limited, so if you need to cancel or reschedule, please call the Radiology Department at [phone]  24 hours in advance to avoid a cancellation fee of $100.00  What to Expect  After you Arrive:  Once you arrive and check in for your appointment, you will be taken to a preparation room within the Radiology Department.  A technologist or Nurse will obtain your medical history, verify that you are correctly prepped for the exam, and explain the procedure.  Afterwards,  an IV will be started in your arm and electrodes will be placed on your skin for EKG monitoring during the stress portion of the exam. Then  you will be escorted to the PET/CT scanner.  There, staff will get you positioned on the scanner and obtain a blood pressure and EKG.  During the exam, you will continue to be connected to the EKG and blood pressure machines.  A small, safe amount of a radioactive tracer will be injected in your IV to obtain a series of pictures of your heart along with an injection of a stress agent.    After your Exam:  It is recommended that you eat a meal and drink a caffeinated beverage to counter act any effects of the stress agent.  Drink plenty of fluids for the remainder of the day and urinate frequently for the first couple of hours after the exam.  Your doctor will inform you of your test results within 7-10 business days.  For questions about your test or how to prepare for your test, please call: Marchia Bond, Cardiac Imaging Nurse Navigator  Gordy Clement, Cardiac Imaging Nurse Navigator Office: [phone]

## 2022-07-18 DIAGNOSIS — Z79899 Other long term (current) drug therapy: Secondary | ICD-10-CM | POA: Diagnosis not present

## 2022-07-19 LAB — HEPATIC FUNCTION PANEL
ALT: 19 IU/L (ref 0–44)
AST: 17 IU/L (ref 0–40)
Albumin: 4.5 g/dL (ref 3.9–4.9)
Alkaline Phosphatase: 47 IU/L (ref 44–121)
Bilirubin Total: 0.5 mg/dL (ref 0.0–1.2)
Bilirubin, Direct: 0.16 mg/dL (ref 0.00–0.40)
Total Protein: 6.7 g/dL (ref 6.0–8.5)

## 2022-07-19 LAB — LIPOPROTEIN A (LPA): Lipoprotein (a): <8.4 nmol/L (ref <75.0)

## 2022-07-19 LAB — LIPID PANEL
Chol/HDL Ratio: 3.4 ratio (ref 0.0–5.0)
Cholesterol, Total: 144 mg/dL (ref 100–199)
HDL: 42 mg/dL (ref >39)
LDL Chol Calc (NIH): 46 mg/dL (ref 0–99)
Triglycerides: 381 mg/dL — ABNORMAL HIGH (ref 0–149)
VLDL Cholesterol Cal: 56 mg/dL — ABNORMAL HIGH (ref 5–40)

## 2022-08-07 DIAGNOSIS — E1169 Type 2 diabetes mellitus with other specified complication: Secondary | ICD-10-CM | POA: Diagnosis not present

## 2022-08-07 DIAGNOSIS — I700 Atherosclerosis of aorta: Secondary | ICD-10-CM | POA: Diagnosis not present

## 2022-08-07 DIAGNOSIS — R0602 Shortness of breath: Secondary | ICD-10-CM | POA: Diagnosis not present

## 2022-08-07 DIAGNOSIS — I10 Essential (primary) hypertension: Secondary | ICD-10-CM | POA: Diagnosis not present

## 2022-08-07 DIAGNOSIS — E785 Hyperlipidemia, unspecified: Secondary | ICD-10-CM | POA: Diagnosis not present

## 2022-08-07 DIAGNOSIS — Z0000 Encounter for general adult medical examination without abnormal findings: Secondary | ICD-10-CM | POA: Diagnosis not present

## 2022-08-13 DIAGNOSIS — R972 Elevated prostate specific antigen [PSA]: Secondary | ICD-10-CM | POA: Diagnosis not present

## 2022-08-20 DIAGNOSIS — N401 Enlarged prostate with lower urinary tract symptoms: Secondary | ICD-10-CM | POA: Diagnosis not present

## 2022-08-20 DIAGNOSIS — R351 Nocturia: Secondary | ICD-10-CM | POA: Diagnosis not present

## 2022-08-20 DIAGNOSIS — R972 Elevated prostate specific antigen [PSA]: Secondary | ICD-10-CM | POA: Diagnosis not present

## 2022-09-17 ENCOUNTER — Telehealth (HOSPITAL_COMMUNITY): Payer: Self-pay | Admitting: *Deleted

## 2022-09-17 DIAGNOSIS — I25709 Atherosclerosis of coronary artery bypass graft(s), unspecified, with unspecified angina pectoris: Secondary | ICD-10-CM

## 2022-09-17 DIAGNOSIS — R072 Precordial pain: Secondary | ICD-10-CM

## 2022-09-17 NOTE — Telephone Encounter (Signed)
Reaching out to patient to offer assistance regarding upcoming cardiac imaging study; pt verbalizes understanding of appt date/time, parking situation and where to check in, pre-test NPO status, and verified current allergies; name and call back number provided for further questions should they arise  Benjamin Belknap RN Navigator Cardiac Imaging [facility] [phone] office [phone] cell  Patient aware to avoid caffeine 12 hours prior to his cardiac PET scan. 

## 2022-09-17 NOTE — Telephone Encounter (Signed)
Informed consent for Cardiac PET Stress test placed for MD signature.

## 2022-09-18 ENCOUNTER — Encounter (HOSPITAL_COMMUNITY)
Admission: RE | Admit: 2022-09-18 | Discharge: 2022-09-18 | Disposition: A | Discharge status: Home / Self Care | Payer: PPO | Source: Ambulatory Visit | Attending: Cardiology | Admitting: Cardiology

## 2022-09-18 DIAGNOSIS — I2510 Atherosclerotic heart disease of native coronary artery without angina pectoris: Secondary | ICD-10-CM | POA: Diagnosis not present

## 2022-09-18 MED ORDER — RUBIDIUM RB82 GENERATOR (RUBYFILL)
27.8000 | PACK | Freq: Once | INTRAVENOUS | Status: AC
  Administered 2022-09-18: 27.8 via INTRAVENOUS

## 2022-09-18 MED ORDER — REGADENOSON 0.4 MG/5ML IV SOLN: 0.4000 mg | Freq: Once | INTRAVENOUS | Status: AC

## 2022-09-18 MED ORDER — REGADENOSON 0.4 MG/5ML IV SOLN
INTRAVENOUS | Status: AC
  Administered 2022-09-18: 0.4 mg via INTRAVENOUS
  Filled 2022-09-18: qty 5

## 2022-09-19 LAB — NM PET CT CARDIAC PERFUSION MULTI W/ABSOLUTE BLOODFLOW
MBFR: 1.85
Nuc Rest EF: 22 %
Nuc Stress EF: 13 %
Rest MBF: 0.99 ml/g/min
Rest Nuclear Isotope Dose: 27.8 mCi
ST Depression (mm): 0 mm
Stress MBF: 1.83 ml/g/min
Stress Nuclear Isotope Dose: 27.8 mCi

## 2022-09-20 ENCOUNTER — Other Ambulatory Visit: Payer: Self-pay | Admitting: *Deleted

## 2022-09-20 DIAGNOSIS — I429 Cardiomyopathy, unspecified: Secondary | ICD-10-CM

## 2022-09-27 ENCOUNTER — Ambulatory Visit: Payer: PPO | Admitting: Physician Assistant

## 2022-09-28 ENCOUNTER — Ambulatory Visit (INDEPENDENT_AMBULATORY_CARE_PROVIDER_SITE_OTHER): Payer: PPO

## 2022-09-28 DIAGNOSIS — I429 Cardiomyopathy, unspecified: Secondary | ICD-10-CM

## 2022-09-28 MED ORDER — PERFLUTREN LIPID MICROSPHERE
1.0000 mL | INTRAVENOUS | Status: AC | PRN
  Administered 2022-09-28: 3 mL via INTRAVENOUS

## 2022-10-03 LAB — ECHOCARDIOGRAM COMPLETE
AR max vel: 2.4 cm2
AV Area VTI: 2.39 cm2
AV Area mean vel: 2.57 cm2
AV Mean grad: 5 mmHg
AV Peak grad: 9.1 mmHg
Ao pk vel: 1.51 m/s
Area-P 1/2: 3.68 cm2
S' Lateral: 3.83 cm

## 2022-10-08 NOTE — Progress Notes (Unsigned)
Cardiology Office Note   Date:  10/10/2022   ID:  Benjamin Erickson, DOB 10-02-1951, MRN [id number]  PCP:  London Pepper, MD  Cardiologist:   Minus Breeding, MD Referring:  London Pepper, MD  Chief Complaint  Patient presents with   Cardiomyopathy   Shortness of Breath     History of Present Illness: Benjamin Erickson is a 71 y.o. male who presents for evaluation of chest discomfort.   He had a CT with a calcium score of 1805.    He had diffuse non obstructive plaque as described below.  Unfortunately, FFR not able to be performed secondary to artifact. He had a negative POET (Plain Old Exercise Treadmill) in 2021.      He had SOB and PET scanning suggested an EF of 15%.  There was a small fixed inferior defect.  There was a questions of multivessel disease.    EF on echo was 35%.  There was a question of a small lesion on the aortic valve.    Cardiac cath is indicated and he came to talk about this today.  He has had slightly increased SOB.  The patient denies any new symptoms such as chest discomfort, neck or arm discomfort. There has been no PND or orthopnea. There have been no reported palpitations, presyncope or syncope.   Past Medical History:  Diagnosis Date   Bigeminy    DM (diabetes mellitus) ([place])    Frequent PVCs    Hyperlipidemia    Hypertension    Nocturia     Past Surgical History:  Procedure Laterality Date   HERNIA REPAIR     Inguinal      Current Outpatient Medications  Medication Sig Dispense Refill   acetaminophen (TYLENOL) 325 MG tablet Take 650 mg by mouth every 6 (six) hours as needed.     aspirin EC 81 MG tablet Take 81 mg by mouth daily. Swallow whole.     metFORMIN (GLUCOPHAGE) 500 MG tablet Take 500 mg by mouth 2 (two) times daily.     PRALUENT 150 MG/ML SOAJ INJECT 150 MG INTO THE SKIN EVERY 14 (FOURTEEN) DAYS. 6 mL 3   rosuvastatin (CRESTOR) 10 MG tablet TAKE 1 TABLET BY MOUTH ONCE A WEEK. 90 tablet 1   sacubitril-valsartan (ENTRESTO)  24-26 MG Take 1 tablet by mouth 2 (two) times daily. 60 tablet 6   tamsulosin (FLOMAX) 0.4 MG CAPS capsule Take 0.4 mg by mouth daily.     No current facility-administered medications for this visit.    Allergies:   Sulfa antibiotics    ROS:  Please see the history of present illness.   Otherwise, review of systems are positive for none.   All other systems are reviewed and negative.    PHYSICAL EXAM: VS:  BP 134/72 (BP Location: Left Arm, Patient Position: Sitting, Cuff Size: Large)   Pulse 72   Ht '5\' 6"'$  (1.676 m)   Wt 231 lb 9.6 oz (105.1 kg)   SpO2 97%   BMI 37.38 kg/m  , BMI Body mass index is 37.38 kg/m. GENERAL:  Well appearing NECK:  No jugular venous distention, waveform within normal limits, carotid upstroke brisk and symmetric, no bruits, no thyromegaly LUNGS:  Clear to auscultation bilaterally CHEST:  Unremarkable HEART:  PMI not displaced or sustained,S1 and S2 within normal limits, no S3, no S4, no clicks, no rubs, no murmurs ABD:  Flat, positive bowel sounds normal in frequency in pitch, no bruits, no rebound,  no guarding, no midline pulsatile mass, no hepatomegaly, no splenomegaly EXT:  2 plus pulses throughout, no edema, no cyanosis no clubbing  EKG:  EKG is  ordered today. Sinus rhythm, rate 72, left axis deviation, interventricular conduction delay, incomplete left bundle branch block, no change.  Premature ectopic complexes   Recent Labs: 07/18/2022: ALT 19 10/09/2022: BUN 24; Creatinine, Ser 1.09; Hemoglobin 15.4; Platelets 397; Potassium 4.7; Sodium 141    Lipid Panel    Component Value Date/Time   CHOL 144 07/18/2022 0825   TRIG 381 (H) 07/18/2022 0825   HDL 42 07/18/2022 0825   CHOLHDL 3.4 07/18/2022 0825   LDLCALC 46 07/18/2022 0825      Wt Readings from Last 3 Encounters:  10/09/22 231 lb 9.6 oz (105.1 kg)  07/16/22 236 lb (107 kg)  05/25/21 228 lb 3.2 oz (103.5 kg)      Other studies Reviewed: Additional studies/ records that were  reviewed today include:   PET, echo Review of the above records demonstrates:  Please see elsewhere in the note.     ASSESSMENT AND PLAN:  CAD:    The patient had high risk findings on his PET scan and echocardiogram.  Cardiac catheterization is indicated.  The patient understands that risks included but are not limited to stroke (1 in 1000), death (1 in 39), kidney failure [usually temporary] (1 in 500), bleeding (1 in 200), allergic reaction [possibly serious] (1 in 200).  The patient understands and agrees to proceed.   DYSLIPIDEMIA: His LDL was 65, LDL 108.  Goal of therapy should be an LDL less than 70.   HTN:    The blood pressure is at target.  No change in therapy.  DM: A1c was 6.0 which was down from 6.3.  No change in therapy.  He will eventually need SGLT2 inhibitor as management of his cardiomyopathy.   Cardiomyopathy: I suspect this is ischemic.  I am going to switch him to [facility].  He has been instructed on how to hold his ACE inhibitor for 36 hours prior to starting 24/26 twice daily.  I will then continue with med titration.    Current medicines are reviewed at length with the patient today.  The patient does not have concerns regarding medicines.  The following changes have been made:  None  Labs/ tests ordered today include:     Orders Placed This Encounter  Procedures   Basic metabolic panel   CBC   EKG 12-Lead    Disposition:   FU with me after the cath.    Signed, Minus Breeding, MD  10/10/2022 7:29 AM    [facility]

## 2022-10-08 NOTE — H&P (View-Only) (Signed)
Cardiology Office Note   Date:  10/10/2022   ID:  Benjamin Erickson, DOB May 24, 1951, MRN [id number]  PCP:  London Pepper, MD  Cardiologist:   Minus Breeding, MD Referring:  London Pepper, MD  Chief Complaint  Patient presents with   Cardiomyopathy   Shortness of Breath     History of Present Illness: Benjamin Erickson is a 71 y.o. male who presents for evaluation of chest discomfort.   He had a CT with a calcium score of 1805.    He had diffuse non obstructive plaque as described below.  Unfortunately, FFR not able to be performed secondary to artifact. He had a negative POET (Plain Old Exercise Treadmill) in 2021.      He had SOB and PET scanning suggested an EF of 15%.  There was a small fixed inferior defect.  There was a questions of multivessel disease.    EF on echo was 35%.  There was a question of a small lesion on the aortic valve.    Cardiac cath is indicated and he came to talk about this today.  He has had slightly increased SOB.  The patient denies any new symptoms such as chest discomfort, neck or arm discomfort. There has been no PND or orthopnea. There have been no reported palpitations, presyncope or syncope.   Past Medical History:  Diagnosis Date   Bigeminy    DM (diabetes mellitus) ([place])    Frequent PVCs    Hyperlipidemia    Hypertension    Nocturia     Past Surgical History:  Procedure Laterality Date   HERNIA REPAIR     Inguinal      Current Outpatient Medications  Medication Sig Dispense Refill   acetaminophen (TYLENOL) 325 MG tablet Take 650 mg by mouth every 6 (six) hours as needed.     aspirin EC 81 MG tablet Take 81 mg by mouth daily. Swallow whole.     metFORMIN (GLUCOPHAGE) 500 MG tablet Take 500 mg by mouth 2 (two) times daily.     PRALUENT 150 MG/ML SOAJ INJECT 150 MG INTO THE SKIN EVERY 14 (FOURTEEN) DAYS. 6 mL 3   rosuvastatin (CRESTOR) 10 MG tablet TAKE 1 TABLET BY MOUTH ONCE A WEEK. 90 tablet 1   sacubitril-valsartan (ENTRESTO)  24-26 MG Take 1 tablet by mouth 2 (two) times daily. 60 tablet 6   tamsulosin (FLOMAX) 0.4 MG CAPS capsule Take 0.4 mg by mouth daily.     No current facility-administered medications for this visit.    Allergies:   Sulfa antibiotics    ROS:  Please see the history of present illness.   Otherwise, review of systems are positive for none.   All other systems are reviewed and negative.    PHYSICAL EXAM: VS:  BP 134/72 (BP Location: Left Arm, Patient Position: Sitting, Cuff Size: Large)   Pulse 72   Ht '5\' 6"'$  (1.676 m)   Wt 231 lb 9.6 oz (105.1 kg)   SpO2 97%   BMI 37.38 kg/m  , BMI Body mass index is 37.38 kg/m. GENERAL:  Well appearing NECK:  No jugular venous distention, waveform within normal limits, carotid upstroke brisk and symmetric, no bruits, no thyromegaly LUNGS:  Clear to auscultation bilaterally CHEST:  Unremarkable HEART:  PMI not displaced or sustained,S1 and S2 within normal limits, no S3, no S4, no clicks, no rubs, no murmurs ABD:  Flat, positive bowel sounds normal in frequency in pitch, no bruits, no rebound,  no guarding, no midline pulsatile mass, no hepatomegaly, no splenomegaly EXT:  2 plus pulses throughout, no edema, no cyanosis no clubbing  EKG:  EKG is  ordered today. Sinus rhythm, rate 72, left axis deviation, interventricular conduction delay, incomplete left bundle branch block, no change.  Premature ectopic complexes   Recent Labs: 07/18/2022: ALT 19 10/09/2022: BUN 24; Creatinine, Ser 1.09; Hemoglobin 15.4; Platelets 397; Potassium 4.7; Sodium 141    Lipid Panel    Component Value Date/Time   CHOL 144 07/18/2022 0825   TRIG 381 (H) 07/18/2022 0825   HDL 42 07/18/2022 0825   CHOLHDL 3.4 07/18/2022 0825   LDLCALC 46 07/18/2022 0825      Wt Readings from Last 3 Encounters:  10/09/22 231 lb 9.6 oz (105.1 kg)  07/16/22 236 lb (107 kg)  05/25/21 228 lb 3.2 oz (103.5 kg)      Other studies Reviewed: Additional studies/ records that were  reviewed today include:   PET, echo Review of the above records demonstrates:  Please see elsewhere in the note.     ASSESSMENT AND PLAN:  CAD:    The patient had high risk findings on his PET scan and echocardiogram.  Cardiac catheterization is indicated.  The patient understands that risks included but are not limited to stroke (1 in 1000), death (1 in 16), kidney failure [usually temporary] (1 in 500), bleeding (1 in 200), allergic reaction [possibly serious] (1 in 200).  The patient understands and agrees to proceed.   DYSLIPIDEMIA: His LDL was 65, LDL 108.  Goal of therapy should be an LDL less than 70.   HTN:    The blood pressure is at target.  No change in therapy.  DM: A1c was 6.0 which was down from 6.3.  No change in therapy.  He will eventually need SGLT2 inhibitor as management of his cardiomyopathy.   Cardiomyopathy: I suspect this is ischemic.  I am going to switch him to [facility].  He has been instructed on how to hold his ACE inhibitor for 36 hours prior to starting 24/26 twice daily.  I will then continue with med titration.    Current medicines are reviewed at length with the patient today.  The patient does not have concerns regarding medicines.  The following changes have been made:  None  Labs/ tests ordered today include:     Orders Placed This Encounter  Procedures   Basic metabolic panel   CBC   EKG 12-Lead    Disposition:   FU with me after the cath.    Signed, Minus Breeding, MD  10/10/2022 7:29 AM    [facility]

## 2022-10-09 ENCOUNTER — Telehealth: Payer: PPO | Admitting: Cardiology

## 2022-10-09 ENCOUNTER — Encounter: Payer: Self-pay | Admitting: Cardiology

## 2022-10-09 ENCOUNTER — Ambulatory Visit: Payer: PPO | Attending: Cardiology | Admitting: Cardiology

## 2022-10-09 VITALS — BP 134/72 | HR 72 | Ht 66.0 in | Wt 231.6 lb

## 2022-10-09 DIAGNOSIS — I2510 Atherosclerotic heart disease of native coronary artery without angina pectoris: Secondary | ICD-10-CM

## 2022-10-09 DIAGNOSIS — E118 Type 2 diabetes mellitus with unspecified complications: Secondary | ICD-10-CM | POA: Diagnosis not present

## 2022-10-09 DIAGNOSIS — I10 Essential (primary) hypertension: Secondary | ICD-10-CM

## 2022-10-09 DIAGNOSIS — Z01812 Encounter for preprocedural laboratory examination: Secondary | ICD-10-CM

## 2022-10-09 DIAGNOSIS — E785 Hyperlipidemia, unspecified: Secondary | ICD-10-CM | POA: Diagnosis not present

## 2022-10-09 MED ORDER — SACUBITRIL-VALSARTAN 24-26 MG PO TABS: 1.0000 | ORAL_TABLET | Freq: Two times a day (BID) | ORAL | 6 refills | Status: DC

## 2022-10-09 NOTE — Patient Instructions (Addendum)
     Cardiac/Peripheral Catheterization   You are scheduled for a Cardiac Catheterization on October 16, 2022 with Dr. Peter Martinique, MD.  1. Please arrive at the Main Entrance A at [facility]: [place], [place] [address] on October 16, 2022 at 10:00 am (This time is two hours before your procedure to ensure your preparation). Free valet parking service is available. You will check in at ADMITTING. The support person will be asked to wait in the waiting room.  It is OK to have someone drop you off and come back when you are ready to be discharged.        Special note: Every effort is made to have your procedure done on time. Please understand that emergencies sometimes delay scheduled procedures.   . 2. Diet: Do not eat solid foods after midnight.  You may have clear liquids until 5 AM the day of the procedure.  3. Labs: completed today  4. Medication instructions in preparation for your procedure:   Contrast Allergy: No  Hold lisinopril/hctz AM of procedure  Do not take Diabetes Med Glucophage (Metformin) on the day of the procedure and HOLD 48 HOURS AFTER THE PROCEDURE.  On the morning of your procedure, take Aspirin 81 mg and any morning medicines NOT listed above.  You may use sips of water.  5. Plan to go home the same day, you will only stay overnight if medically necessary. 6. You MUST have a responsible adult to drive you home. 7. An adult MUST be with you the first 24 hours after you arrive home. 8. Bring a current list of your medications, and the last time and date medication taken. 9. Bring ID and current insurance cards. 10.Please wear clothes that are easy to get on and off and wear slip-on shoes.  Thank you for allowing [place] to care for you!   -- [facility]  Medication changes for your appointment today:  Stop Lisinopril-HCTZ.  Start Entresto 24/26 Twice daily.   Follow up:  Your follow up after the CATH  is scheduled for January 5th, 2024 with Coletta Memos, FNP-C at 3:10 pm, here in our office.

## 2022-10-10 ENCOUNTER — Encounter: Payer: Self-pay | Admitting: Cardiology

## 2022-10-10 LAB — BASIC METABOLIC PANEL
BUN/Creatinine Ratio: 22 (ref 10–24)
BUN: 24 mg/dL (ref 8–27)
CO2: 23 mmol/L (ref 20–29)
Calcium: 10.2 mg/dL (ref 8.6–10.2)
Chloride: 101 mmol/L (ref 96–106)
Creatinine, Ser: 1.09 mg/dL (ref 0.76–1.27)
Glucose: 131 mg/dL — ABNORMAL HIGH (ref 70–99)
Potassium: 4.7 mmol/L (ref 3.5–5.2)
Sodium: 141 mmol/L (ref 134–144)
eGFR: 73 mL/min/{1.73_m2} (ref >59)

## 2022-10-10 LAB — CBC
Hematocrit: 45.8 % (ref 37.5–51.0)
Hemoglobin: 15.4 g/dL (ref 13.0–17.7)
MCH: 32.2 pg (ref 26.6–33.0)
MCHC: 33.6 g/dL (ref 31.5–35.7)
MCV: 96 fL (ref 79–97)
Platelets: 397 10*3/uL (ref 150–450)
RBC: 4.79 x10E6/uL (ref 4.14–5.80)
RDW: 11.9 % (ref 11.6–15.4)
WBC: 11.9 10*3/uL — ABNORMAL HIGH (ref 3.4–10.8)

## 2022-10-15 ENCOUNTER — Telehealth: Payer: Self-pay | Admitting: *Deleted

## 2022-10-15 NOTE — Telephone Encounter (Signed)
Cardiac Catheterization scheduled at [facility] for: Tuesday October 16, 2022 12 Noon Arrival time and place: [facility] Main Entrance A at: 10 AM  Nothing to eat after midnight prior to procedure, clear liquids until 5 AM day of procedure.  Medication instructions: -Hold:  Metformin-day of procedure and 48 hours post procedure -Except hold medications usual morning medications can be taken with sips of water including aspirin 81 mg.  Confirmed patient has responsible adult to drive home post procedure and be with patient first 24 hours after arriving home.  Patient reports no new symptoms concerning for COVID-19 in the past 10 days.  Reviewed procedure Instructions with patient.

## 2022-10-16 ENCOUNTER — Other Ambulatory Visit: Payer: Self-pay

## 2022-10-16 ENCOUNTER — Encounter (HOSPITAL_COMMUNITY)
Admission: RE | Disposition: A | Discharge status: Home / Self Care | Payer: Self-pay | Source: Home / Self Care | Attending: Cardiology

## 2022-10-16 ENCOUNTER — Ambulatory Visit (HOSPITAL_COMMUNITY)
Admission: RE | Admit: 2022-10-16 | Discharge: 2022-10-16 | Disposition: A | Discharge status: Home / Self Care | Payer: PPO | Attending: Cardiology | Admitting: Cardiology

## 2022-10-16 DIAGNOSIS — I110 Hypertensive heart disease with heart failure: Secondary | ICD-10-CM | POA: Insufficient documentation

## 2022-10-16 DIAGNOSIS — I429 Cardiomyopathy, unspecified: Secondary | ICD-10-CM | POA: Diagnosis not present

## 2022-10-16 DIAGNOSIS — I5042 Chronic combined systolic (congestive) and diastolic (congestive) heart failure: Secondary | ICD-10-CM

## 2022-10-16 DIAGNOSIS — I2510 Atherosclerotic heart disease of native coronary artery without angina pectoris: Secondary | ICD-10-CM

## 2022-10-16 DIAGNOSIS — E785 Hyperlipidemia, unspecified: Secondary | ICD-10-CM | POA: Insufficient documentation

## 2022-10-16 DIAGNOSIS — Z7984 Long term (current) use of oral hypoglycemic drugs: Secondary | ICD-10-CM | POA: Insufficient documentation

## 2022-10-16 DIAGNOSIS — I5022 Chronic systolic (congestive) heart failure: Secondary | ICD-10-CM | POA: Diagnosis not present

## 2022-10-16 DIAGNOSIS — E119 Type 2 diabetes mellitus without complications: Secondary | ICD-10-CM | POA: Insufficient documentation

## 2022-10-16 HISTORY — PX: LEFT HEART CATH AND CORONARY ANGIOGRAPHY: CATH118249

## 2022-10-16 LAB — GLUCOSE, CAPILLARY: Glucose-Capillary: 119 mg/dL — ABNORMAL HIGH (ref 70–99)

## 2022-10-16 SURGERY — LEFT HEART CATH AND CORONARY ANGIOGRAPHY
Anesthesia: LOCAL

## 2022-10-16 MED ORDER — SODIUM CHLORIDE 0.9 % IV SOLN: 250.0000 mL | INTRAVENOUS | Status: DC | PRN

## 2022-10-16 MED ORDER — SODIUM CHLORIDE 0.9% FLUSH: 3.0000 mL | INTRAVENOUS | Status: DC | PRN

## 2022-10-16 MED ORDER — HEPARIN (PORCINE) IN NACL 1000-0.9 UT/500ML-% IV SOLN
INTRAVENOUS | Status: DC | PRN
  Administered 2022-10-16 (×2): 500 mL

## 2022-10-16 MED ORDER — FENTANYL CITRATE (PF) 100 MCG/2ML IJ SOLN
INTRAMUSCULAR | Status: DC | PRN
  Administered 2022-10-16: 25 ug via INTRAVENOUS

## 2022-10-16 MED ORDER — SODIUM CHLORIDE 0.9 % IV SOLN: INTRAVENOUS | Status: DC

## 2022-10-16 MED ORDER — LIDOCAINE HCL (PF) 1 % IJ SOLN
INTRAMUSCULAR | Status: DC | PRN
  Administered 2022-10-16: 2 mL via INTRADERMAL

## 2022-10-16 MED ORDER — SODIUM CHLORIDE 0.9% FLUSH: 3.0000 mL | Freq: Two times a day (BID) | INTRAVENOUS | Status: DC

## 2022-10-16 MED ORDER — VERAPAMIL HCL 2.5 MG/ML IV SOLN
INTRAVENOUS | Status: AC
  Filled 2022-10-16: qty 2

## 2022-10-16 MED ORDER — SODIUM CHLORIDE 0.9 % WEIGHT BASED INFUSION: 1.0000 mL/kg/h | INTRAVENOUS | Status: DC

## 2022-10-16 MED ORDER — MIDAZOLAM HCL 2 MG/2ML IJ SOLN
INTRAMUSCULAR | Status: DC | PRN
  Administered 2022-10-16: 1 mg via INTRAVENOUS

## 2022-10-16 MED ORDER — VERAPAMIL HCL 2.5 MG/ML IV SOLN
INTRAVENOUS | Status: DC | PRN
  Administered 2022-10-16: 10 mL via INTRA_ARTERIAL

## 2022-10-16 MED ORDER — MIDAZOLAM HCL 2 MG/2ML IJ SOLN
INTRAMUSCULAR | Status: AC
  Filled 2022-10-16: qty 2

## 2022-10-16 MED ORDER — HEPARIN SODIUM (PORCINE) 1000 UNIT/ML IJ SOLN
INTRAMUSCULAR | Status: AC
  Filled 2022-10-16: qty 10

## 2022-10-16 MED ORDER — METFORMIN HCL 500 MG PO TABS: 500.0000 mg | ORAL_TABLET | Freq: Two times a day (BID) | ORAL | Status: AC

## 2022-10-16 MED ORDER — FENTANYL CITRATE (PF) 100 MCG/2ML IJ SOLN
INTRAMUSCULAR | Status: AC
  Filled 2022-10-16: qty 2

## 2022-10-16 MED ORDER — HEPARIN SODIUM (PORCINE) 1000 UNIT/ML IJ SOLN
INTRAMUSCULAR | Status: DC | PRN
  Administered 2022-10-16: 5000 [IU] via INTRAVENOUS

## 2022-10-16 MED ORDER — LIDOCAINE HCL (PF) 1 % IJ SOLN
INTRAMUSCULAR | Status: AC
  Filled 2022-10-16: qty 30

## 2022-10-16 MED ORDER — ASPIRIN 81 MG PO CHEW: 81.0000 mg | CHEWABLE_TABLET | ORAL | Status: DC

## 2022-10-16 MED ORDER — ACETAMINOPHEN 325 MG PO TABS: 650.0000 mg | ORAL_TABLET | ORAL | Status: DC | PRN

## 2022-10-16 MED ORDER — IOHEXOL 350 MG/ML SOLN
INTRAVENOUS | Status: DC | PRN
  Administered 2022-10-16: 65 mL via INTRA_ARTERIAL

## 2022-10-16 MED ORDER — ONDANSETRON HCL 4 MG/2ML IJ SOLN: 4.0000 mg | Freq: Four times a day (QID) | INTRAMUSCULAR | Status: DC | PRN

## 2022-10-16 MED ORDER — HYDRALAZINE HCL 20 MG/ML IJ SOLN: 10.0000 mg | INTRAMUSCULAR | Status: DC | PRN

## 2022-10-16 MED ORDER — HEPARIN (PORCINE) IN NACL 1000-0.9 UT/500ML-% IV SOLN
INTRAVENOUS | Status: AC
  Filled 2022-10-16: qty 1000

## 2022-10-16 SURGICAL SUPPLY — 10 items
CATH 5FR JL3.5 JR4 ANG PIG MP (CATHETERS) IMPLANT
DEVICE RAD COMP TR BAND LRG (VASCULAR PRODUCTS) IMPLANT
GLIDESHEATH SLEND SS 6F .021 (SHEATH) IMPLANT
GUIDEWIRE INQWIRE 1.5J.035X260 (WIRE) IMPLANT
INQWIRE 1.5J .035X260CM (WIRE) ×1
KIT HEART LEFT (KITS) ×1 IMPLANT
PACK CARDIAC CATHETERIZATION (CUSTOM PROCEDURE TRAY) ×1 IMPLANT
SYR MEDRAD MARK 7 150ML (SYRINGE) ×1 IMPLANT
TRANSDUCER W/STOPCOCK (MISCELLANEOUS) ×1 IMPLANT
TUBING CIL FLEX 10 FLL-RA (TUBING) ×1 IMPLANT

## 2022-10-16 NOTE — Progress Notes (Signed)
Patient and wife was given discharge information. Both verbalized understanding.

## 2022-10-16 NOTE — Discharge Instructions (Signed)

## 2022-10-16 NOTE — Progress Notes (Signed)
TR BAND REMOVAL  LOCATION:    right radial  DEFLATED PER PROTOCOL:    Yes.    TIME BAND OFF / DRESSING APPLIED:    1600   SITE UPON ARRIVAL:    Level 0  SITE AFTER BAND REMOVAL:    Level 0  CIRCULATION SENSATION AND MOVEMENT:    Within Normal Limits   Yes.    COMMENTS:   no bleeding noted

## 2022-10-16 NOTE — Interval H&P Note (Signed)
History and Physical Interval Note:  10/16/2022 1:27 PM  [name]  has presented today for surgery, with the diagnosis of heart failure.  The various methods of treatment have been discussed with the patient and family. After consideration of risks, benefits and other options for treatment, the patient has consented to  Procedure(s): LEFT HEART CATH AND CORONARY ANGIOGRAPHY (N/A) as a surgical intervention.  The patient's history has been reviewed, patient examined, no change in status, stable for surgery.  I have reviewed the patient's chart and labs.  Questions were answered to the patient's satisfaction.    Cath Lab Visit (complete for each Cath Lab visit)  Clinical Evaluation Leading to the Procedure:   ACS: No.  Non-ACS:    Anginal Classification: CCS III  Anti-ischemic medical therapy: No Therapy  Non-Invasive Test Results: High-risk stress test findings: cardiac mortality >3%/year  Prior CABG: No previous CABG       Benjamin Erickson 10/16/2022 1:27 PM

## 2022-10-17 ENCOUNTER — Encounter (HOSPITAL_COMMUNITY): Payer: Self-pay | Admitting: Cardiology

## 2022-10-27 NOTE — Progress Notes (Unsigned)
Cardiology Clinic Note   Patient Name: Benjamin Erickson Date of Encounter: 10/27/2022  Primary Care Provider:  London Pepper, MD Primary Cardiologist:  Minus Breeding, MD  Patient Profile    Benjamin Erickson is a 71 y.o. male with a past medical history of CAD, chronic systolic heart failure, hypertension, hyperlipidemia, insulin dependent diabetes who presents to the clinic today for follow-up after heart catheterization.   Past Medical History    Past Medical History:  Diagnosis Date   Bigeminy    DM (diabetes mellitus) ([place])    Frequent PVCs    Hyperlipidemia    Hypertension    Nocturia    Past Surgical History:  Procedure Laterality Date   HERNIA REPAIR     Inguinal    LEFT HEART CATH AND CORONARY ANGIOGRAPHY N/A 10/16/2022   Procedure: LEFT HEART CATH AND CORONARY ANGIOGRAPHY;  Surgeon: Martinique, Peter M, MD;  Location: [facility];  Service: Cardiovascular;  Laterality: N/A;    Allergies  Allergies  Allergen Reactions   Sulfa Antibiotics Hives    History of Present Illness    Benjamin Erickson has a past medical history of: CAD.  Coronary CT 03/24/2020: Calcium score 1805 (94 percentile). Aortic atherosclerosis. FFR could not be performed secondary to artifact.  Exercise stress test 06/23/2020: Intermediate risk secondary to decreased exercise effort. No ischemic changes noted.  PET CT 09/18/2022: High risk study. Small fixed interior perfusion defect. Systolic function severely reduced. Recommend LHC.  LHC 10/16/2022: Mid LAD 60%, 1st diag 95%, RPDA 80%. Diffuse coronary ectasia. No significant obstruction in major epicardial vessels. Obstructive disease in very small diagonal branch and mid to distal PDA. Recommend medical therapy.  Chronic systolic heart failure.  Echo 09/28/2022: EF 30-35%. Mild hypokinesis anteroseptal/inferoseptal wall. Global hypokinesis. Grade I DD. Aortic valve calcification. Cannot rule out small vegetation on the [place]. Mild  ascending aorta dilatation 39 mm.  Hypertension.  Hyperlipidemia.  Lipid panel 07/18/2022: LDL 45, HDL 42, TG 381, total 144. Insulin dependent diabetes.   Mr. [name] was initially seen by Dr. Percival Spanish on 03/01/2020 for chest pain. Coronary CT as above. Exercise stress test August 2021 showed no ischemic changes. When patient was seen in August 2023 he complained of progressive shortness of breath and some chest discomfort. PET CT October 2023 was a high risk study and LHC was recommended. Systolic function was also found to be severely reduced. Echo showed EF 30-35%, hypokinesis, grade I DD. LHC November 2023 showed obstructive disease in small diagnoal branch and mid to distal PDA. Medical therapy was recommended.   Today, patient ***  CAD. LHC showed obstructive disease in small first diagonal and PDA and nonobstructive disease in LAD. Patient denies *** Continue aspirin, praluent, and crestor.  Chronic systolic heart failure. Echo November 2023 EF 30-35%, grade I DD. Patient denies shortness of breath *** Continue Enresto 24-26 mg.  Hyperlipidemia. LDL 45 07/18/2022, at goal. Patient is statin intolerant. Continue praluent. *** Hypertension. BP today ***. He denies headaches or dizziness. Continue entresto. ***   Home Medications    No outpatient medications have been marked as taking for the 10/30/22 encounter (Appointment) with Deberah Pelton, NP.    Family History    Family History  Problem Relation Age of Onset   Skin cancer Mother    Throat cancer Father    Heart failure Father    CAD Father    He indicated that his mother is deceased. He indicated that his father is  deceased. He indicated that both of his sisters are alive. He indicated that his brother is alive. He indicated that his maternal grandmother is deceased. He indicated that his maternal grandfather is deceased. He indicated that his paternal grandmother is deceased. He indicated that his paternal grandfather is  deceased.   Social History    Social History   Socioeconomic History   Marital status: Married    Spouse name: Not on file   Number of children: Not on file   Years of education: Not on file   Highest education level: Not on file  Occupational History   Not on file  Tobacco Use   Smoking status: Former    Types: Cigarettes   Smokeless tobacco: Never   Tobacco comments:    Quit 40 years ago  Substance and Sexual Activity   Alcohol use: Not Currently   Drug use: Not Currently   Sexual activity: Yes    Partners: Female  Other Topics Concern   Not on file  Social History Narrative   Not on file   Social Determinants of Health   Financial Resource Strain: Not on file  Food Insecurity: Not on file  Transportation Needs: Not on file  Physical Activity: Not on file  Stress: Not on file  Social Connections: Not on file  Intimate Partner Violence: Not on file     Review of Systems    General: *** No chills, fever, night sweats or weight changes.  Cardiovascular:  No chest pain, dyspnea on exertion, edema, orthopnea, palpitations, paroxysmal nocturnal dyspnea. Dermatological: No rash, lesions/masses Respiratory: No cough, dyspnea Urologic: No hematuria, dysuria Abdominal:   No nausea, vomiting, diarrhea, bright red blood per rectum, melena, or hematemesis Neurologic:  No visual changes, weakness, changes in mental status. All other systems reviewed and are otherwise negative except as noted above.  Physical Exam    VS:  There were no vitals taken for this visit. , BMI There is no height or weight on file to calculate BMI. GEN: *** Well nourished, well developed, in no acute distress. HEENT: Normal. Neck: Supple, no JVD, carotid bruits, or masses. Cardiac: RRR, no murmurs, rubs, or gallops. No clubbing, cyanosis, edema.  Radials/DP/PT 2+ and equal bilaterally.  Respiratory:  Respirations regular and unlabored, clear to auscultation bilaterally. GI: Soft, nontender,  nondistended. MS: No deformity or atrophy. Skin: Warm and dry, no rash. Neuro: Strength and sensation are intact. Psych: Normal affect.  Accessory Clinical Findings    The following studies were reviewed for this visit: Cardiac PET CT 09/18/2022:  Narrative & Impression      LV perfusion is abnormal. There is no evidence of ischemia. There is evidence of infarction. Defect 1: There is a small defect with mild reduction in uptake present in the apical to mid inferior location(s) that is fixed. There is abnormal wall motion in the defect area. Consistent with infarction.   Rest left ventricular function is abnormal. Rest global function is severely reduced. Rest EF: 22 %. Stress left ventricular function is abnormal. Stress global function is severely reduced. Stress EF: 13 %. End diastolic cavity size is normal. End systolic cavity size is moderately enlarged.   Myocardial blood flow was computed to be 0.54m/g/min at rest and 1.82mg/min at stress. Global myocardial blood flow reserve was 1.85 and was mildly abnormal.   Coronary calcium was present on the attenuation correction CT images. Severe coronary calcifications were present. Coronary calcifications were present in the left anterior descending artery, left circumflex artery  and right coronary artery distribution(s).   Findings are consistent with prior myocardial infarction. The study is high risk.   Small fixed inferior perfusion defect consistent with infarct.  However, systolic function is severely reduced, and in setting of severe coronary calcifications and decrease in EF with stress and decreased myocardial blood flow reserve, recommend cardiac catheterization to evaluate for severe multivessel CAD.  If no obstructive coronary disease on cath, suspect decreased myocardial blood flow is due to microvascular disease.  Would also recommend echocardiogram to evaluate systolic function.    Recent Labs: 07/18/2022: ALT 19 10/09/2022: BUN  24; Creatinine, Ser 1.09; Hemoglobin 15.4; Platelets 397; Potassium 4.7; Sodium 141   Recent Lipid Panel    Component Value Date/Time   CHOL 144 07/18/2022 0825   TRIG 381 (H) 07/18/2022 0825   HDL 42 07/18/2022 0825   CHOLHDL 3.4 07/18/2022 0825   LDLCALC 46 07/18/2022 0825    No BP recorded.  {Refresh Note OR Click here to enter BP  :1}***    ECG personally reviewed by me today: ***  No significant changes from ***  {Does this patient have ATRIAL FIBRILLATION?[phone]}   Assessment & Plan   ***      Disposition: ***   Justice Britain. Sia Gabrielsen, NP-C     10/27/2022, 2:16 PM [facility] [address] Office [phone] Fax [phone]   I spent *** minutes examining this patient, reviewing medications, and using patient centered shared decision making involving her cardiac care.  Prior to her visit I spent greater than 20 minutes reviewing her past medical history,  medications, and prior cardiac tests.

## 2022-10-30 ENCOUNTER — Ambulatory Visit: Payer: PPO | Attending: General Practice | Admitting: Student

## 2022-10-30 ENCOUNTER — Encounter: Payer: Self-pay | Admitting: General Practice

## 2022-10-30 VITALS — BP 134/76 | HR 95 | Ht 68.0 in | Wt 230.0 lb

## 2022-10-30 DIAGNOSIS — E785 Hyperlipidemia, unspecified: Secondary | ICD-10-CM

## 2022-10-30 DIAGNOSIS — I10 Essential (primary) hypertension: Secondary | ICD-10-CM | POA: Diagnosis not present

## 2022-10-30 DIAGNOSIS — I5022 Chronic systolic (congestive) heart failure: Secondary | ICD-10-CM

## 2022-10-30 DIAGNOSIS — I2510 Atherosclerotic heart disease of native coronary artery without angina pectoris: Secondary | ICD-10-CM | POA: Diagnosis not present

## 2022-10-30 MED ORDER — CARVEDILOL 6.25 MG PO TABS: 6.2500 mg | ORAL_TABLET | Freq: Two times a day (BID) | ORAL | 3 refills | Status: DC

## 2022-10-30 NOTE — Patient Instructions (Signed)
Medication Instructions:  START CARVEDILOL 6.'25MG'$  TWICE DAILY  *If you need a refill on your cardiac medications before your next appointment, please call your pharmacy*  Lab Work: NONE If you have labs (blood work) drawn today and your tests are completely normal, you will receive your results only by:  [place] (if you have MyChart) OR  A paper copy in the mail If you have any lab test that is abnormal or we need to change your treatment, we will call you to review the results.  Testing/Procedures: NONE  Follow-Up: At [facility], you and your health needs are our priority.  As part of our continuing mission to provide you with exceptional heart care, we have created designated Provider Care Teams.  These Care Teams include your primary Cardiologist (physician) and Advanced Practice Providers (APPs -  Physician Assistants and Nurse Practitioners) who all work together to provide you with the care you need, when you need it.  Your next appointment:   AS SCHEDULED   The format for your next appointment:   In Person  Provider:   Mayra Reel, NP     Other Instructions  Important Information About Sugar

## 2022-11-23 ENCOUNTER — Ambulatory Visit: Payer: PPO | Attending: General Practice | Admitting: Student

## 2022-11-23 ENCOUNTER — Encounter: Payer: Self-pay | Admitting: General Practice

## 2022-11-23 VITALS — BP 138/80 | HR 65 | Ht 69.0 in | Wt 231.8 lb

## 2022-11-23 DIAGNOSIS — E785 Hyperlipidemia, unspecified: Secondary | ICD-10-CM

## 2022-11-23 DIAGNOSIS — I10 Essential (primary) hypertension: Secondary | ICD-10-CM | POA: Diagnosis not present

## 2022-11-23 DIAGNOSIS — I5022 Chronic systolic (congestive) heart failure: Secondary | ICD-10-CM

## 2022-11-23 DIAGNOSIS — I2510 Atherosclerotic heart disease of native coronary artery without angina pectoris: Secondary | ICD-10-CM | POA: Diagnosis not present

## 2022-11-23 NOTE — Patient Instructions (Addendum)
Medication Instructions:  INCREASE ENTRESTO 49/'51MG'$  DAILY START TUESDAY 11-27-22  TAKE FUROSEMIDE (LASIX) '20MG'$  X3DAYS THEN STOP-YOU WILL HAVE EXTRA TABS IN THE BOTTLE *If you need a refill on your cardiac medications before your next appointment, please call your pharmacy*  Lab Work: BMET ON 12-11-22-NON-FASTING  If you have labs (blood work) drawn today and your tests are completely normal, you will receive your results only by:  [place] (if you have MyChart) OR A paper copy in the mail  If you have any lab test that is abnormal or we need to change your treatment, we will call you to review the results.  Testing/Procedures: NONE  Follow-Up: At [facility], you and your health needs are our priority.  As part of our continuing mission to provide you with exceptional heart care, we have created designated Provider Care Teams.  These Care Teams include your primary Cardiologist (physician) and Advanced Practice Providers (APPs -  Physician Assistants and Nurse Practitioners) who all work together to provide you with the care you need, when you need it.  Your next appointment:   1 month(s)  The format for your next appointment:   In Person  Provider:   Minus Breeding, MD  or APP    Other Instructions CALL MONDAY OR TUESDAY AS DISCUSSED WITH [place]

## 2022-11-23 NOTE — Progress Notes (Signed)
Cardiology Clinic Note   Patient Name: Benjamin Erickson Date of Encounter: 10/27/2022  Primary Care Provider:  London Pepper, MD Primary Cardiologist:  Minus Breeding, MD  Patient Profile    Benjamin Erickson is a 72 y.o. male with a past medical history of CAD, chronic systolic heart failure, hypertension, hyperlipidemia, insulin dependent diabetes who presents to the clinic today for follow-up after medication change.   Past Medical History    Past Medical History:  Diagnosis Date   Bigeminy    DM (diabetes mellitus) ([place])    Frequent PVCs    Hyperlipidemia    Hypertension    Nocturia    Past Surgical History:  Procedure Laterality Date   HERNIA REPAIR     Inguinal    LEFT HEART CATH AND CORONARY ANGIOGRAPHY N/A 10/16/2022   Procedure: LEFT HEART CATH AND CORONARY ANGIOGRAPHY;  Surgeon: Martinique, Peter M, MD;  Location: [place] [facility];  Service: Cardiovascular;  Laterality: N/A;    Allergies  Allergies  Allergen Reactions   Sulfa Antibiotics Hives    History of Present Illness    [name] has a past medical history of: CAD.  Coronary CT 03/24/2020: Calcium score 1805 (94 percentile). Aortic atherosclerosis. FFR could not be performed secondary to artifact.  Exercise stress test 06/23/2020: Intermediate risk secondary to decreased exercise effort. No ischemic changes noted.  PET CT 09/18/2022: High risk study. Small fixed interior perfusion defect. Systolic function severely reduced. Recommend LHC.  LHC 10/16/2022: Mid LAD 60%, 1st diag 95%, RPDA 80%. Diffuse coronary ectasia. No significant obstruction in major epicardial vessels. Obstructive disease in very small diagonal branch and mid to distal PDA. Recommend medical therapy.  Chronic systolic heart failure.  Echo 09/28/2022: EF 30-35%. Mild hypokinesis anteroseptal/inferoseptal wall. Global hypokinesis. Grade I DD. Aortic valve calcification. Cannot rule out small vegetation on the [place]. Mild ascending  aorta dilatation 39 mm.  Hypertension.  Hyperlipidemia.  Lipid panel 07/18/2022: LDL 45, HDL 42, TG 381, total 144. Insulin dependent diabetes.   [name] was initially seen by Dr. Percival Spanish on 03/01/2020 for chest pain. Coronary CT as above. Exercise stress test August 2021 showed no ischemic changes. When patient was seen in August 2023 he complained of progressive shortness of breath and some chest discomfort. PET CT October 2023 was a high risk study and LHC was recommended. Systolic function was also found to be severely reduced. Echo showed EF 30-35%, hypokinesis, grade I DD. LHC November 2023 showed obstructive disease in small diagnoal branch and mid to distal PDA. Medical therapy was recommended.   Patient was last seen in the office on 10/30/2022 for LHC follow-up.  At that time he reported progressive shortness of breath that seem worse than previous.  Episodes were coming in waves and lasting a couple of hours.  He did not notice these episodes as much as his family did pointing out that he appeared to be breathing heavily.  He reported the shortness of breath and fatigue have been life limiting not allowing him to do housework or other activities.  He has been independent with personal hygiene and basic ADLs.  He denied lower extremity edema, abdominal bloating/tightness or increased weight.  He was weighing every day and his weight was stable at 229-231 LB.  Carvedilol 6.25 mg twice daily was added.  BP was slightly elevated at 134/76.   Today, patient reports improvement of fatigue but continued shortness of breath. He reports waking in the middle of the night  gasping for breath 3-4 times a week. He normally lays on his back or side. When this occurs he will prop up on a pillow and it improves. He states walking up a small incline will cause shortness of breath. He denies edema and his weight is stable. He does not restrict his sodium intake. He denies chest pain.      Home  Medications    No outpatient medications have been marked as taking for the 10/30/22 encounter (Appointment) with Deberah Pelton, NP.    Family History    Family History  Problem Relation Age of Onset   Skin cancer Mother    Throat cancer Father    Heart failure Father    CAD Father    He indicated that his mother is deceased. He indicated that his father is deceased. He indicated that both of his sisters are alive. He indicated that his brother is alive. He indicated that his maternal grandmother is deceased. He indicated that his maternal grandfather is deceased. He indicated that his paternal grandmother is deceased. He indicated that his paternal grandfather is deceased.   Social History    Social History   Socioeconomic History   Marital status: Married    Spouse name: Not on file   Number of children: Not on file   Years of education: Not on file   Highest education level: Not on file  Occupational History   Not on file  Tobacco Use   Smoking status: Former    Types: Cigarettes   Smokeless tobacco: Never   Tobacco comments:    Quit 40 years ago  Substance and Sexual Activity   Alcohol use: Not Currently   Drug use: Not Currently   Sexual activity: Yes    Partners: Female  Other Topics Concern   Not on file  Social History Narrative   Not on file   Social Determinants of Health   Financial Resource Strain: Not on file  Food Insecurity: Not on file  Transportation Needs: Not on file  Physical Activity: Not on file  Stress: Not on file  Social Connections: Not on file  Intimate Partner Violence: Not on file     Review of Systems    General:  No chills, fever, night sweats or weight changes.  Cardiovascular:  No chest pain, dyspnea on exertion, edema, orthopnea, palpitations, paroxysmal nocturnal dyspnea. Dermatological: No rash, lesions/masses Respiratory: No cough, dyspnea Urologic: No hematuria, dysuria Abdominal:   No nausea, vomiting, diarrhea,  bright red blood per rectum, melena, or hematemesis Neurologic:  No visual changes, weakness, changes in mental status. All other systems reviewed and are otherwise negative except as noted above.  Physical Exam    VS:  There were no vitals taken for this visit. , BMI There is no height or weight on file to calculate BMI. GEN:  Well nourished, well developed, in no acute distress. HEENT: Normal. Neck: Supple, no JVD, carotid bruits, or masses. Cardiac: RRR, no murmurs, rubs, or gallops. No clubbing, cyanosis, edema.  Radials/DP/PT 2+ and equal bilaterally.  Respiratory:  Respirations regular and unlabored, clear to auscultation bilaterally. GI: Soft, nontender, nondistended. MS: No deformity or atrophy. Skin: Warm and dry, no rash. Neuro: Strength and sensation are intact. Psych: Normal affect.  Accessory Clinical Findings    The following studies were reviewed for this visit: Cardiac PET CT 09/18/2022:  Narrative & Impression      LV perfusion is abnormal. There is no evidence of ischemia. There is evidence  of infarction. Defect 1: There is a small defect with mild reduction in uptake present in the apical to mid inferior location(s) that is fixed. There is abnormal wall motion in the defect area. Consistent with infarction.   Rest left ventricular function is abnormal. Rest global function is severely reduced. Rest EF: 22 %. Stress left ventricular function is abnormal. Stress global function is severely reduced. Stress EF: 13 %. End diastolic cavity size is normal. End systolic cavity size is moderately enlarged.   Myocardial blood flow was computed to be 0.67m/g/min at rest and 1.889mg/min at stress. Global myocardial blood flow reserve was 1.85 and was mildly abnormal.   Coronary calcium was present on the attenuation correction CT images. Severe coronary calcifications were present. Coronary calcifications were present in the left anterior descending artery, left circumflex artery  and right coronary artery distribution(s).   Findings are consistent with prior myocardial infarction. The study is high risk.   Small fixed inferior perfusion defect consistent with infarct.  However, systolic function is severely reduced, and in setting of severe coronary calcifications and decrease in EF with stress and decreased myocardial blood flow reserve, recommend cardiac catheterization to evaluate for severe multivessel CAD.  If no obstructive coronary disease on cath, suspect decreased myocardial blood flow is due to microvascular disease.  Would also recommend echocardiogram to evaluate systolic function.    Recent Labs: 07/18/2022: ALT 19 10/09/2022: BUN 24; Creatinine, Ser 1.09; Hemoglobin 15.4; Platelets 397; Potassium 4.7; Sodium 141   Recent Lipid Panel    Component Value Date/Time   CHOL 144 07/18/2022 0825   TRIG 381 (H) 07/18/2022 0825   HDL 42 07/18/2022 0825   CHOLHDL 3.4 07/18/2022 0825   LDLCALC 46 07/18/2022 0825     ECG is not indicated today.    Assessment & Plan    CAD. LHC showed obstructive disease in small first diagonal and PDA and nonobstructive disease in LAD. Patient denies chest pain.  Continue aspirin, praluent, and crestor.  Chronic systolic heart failure.  NYHA II-III. Echo November 2023 EF 30-35%, grade I DD.  In December 2023 patient reported activity limiting shortness of breath and fatigue.  Patient reports continued shortness of breath, PND and orthopnea. No lower extremity edema or abdominal bloating. His weight is stable. Euvolemic and well compensated on exam. Breath sounds are clear to auscultation. Will do a 3 day trial of 20 mg Lasix to see if breathing improves. When he completes the three days he will call to report status. After the three days he will increase Enresto 49/51 mg bid. Continue carvedilol 6.25 mg twice daily.  Hyperlipidemia. LDL 45 07/18/2022, at goal. Patient is statin intolerant. Continue praluent.  Hypertension. BP  today 148/73 at intake and 138/80 at recheck. He does not restrict his sodium. Educated on importance. He denies headaches or dizziness. Increase Entrest as above (see #2), continue carvedilol.       Disposition: 3 days Lasix 20 mg. Call to discuss if improvement. After 3 days trial of lasix increase Entresto to 49/51 mg. Return in 1 week from Tuesday for BMP. Follow-up in one month or sooner as needed.    DeJustice BritainWittenborn, NP-C     10/27/2022, 2:16 PM [email or web address] Office ([phone] ([phone]

## 2022-11-26 MED ORDER — SACUBITRIL-VALSARTAN 49-51 MG PO TABS: 1.0000 | ORAL_TABLET | Freq: Two times a day (BID) | ORAL | 3 refills | Status: DC

## 2022-11-26 MED ORDER — FUROSEMIDE 20 MG PO TABS: 20.0000 mg | ORAL_TABLET | ORAL | 1 refills | Status: DC | PRN

## 2022-11-27 DIAGNOSIS — H2513 Age-related nuclear cataract, bilateral: Secondary | ICD-10-CM | POA: Diagnosis not present

## 2022-11-27 DIAGNOSIS — H35033 Hypertensive retinopathy, bilateral: Secondary | ICD-10-CM | POA: Diagnosis not present

## 2022-11-27 DIAGNOSIS — H524 Presbyopia: Secondary | ICD-10-CM | POA: Diagnosis not present

## 2022-11-27 DIAGNOSIS — E119 Type 2 diabetes mellitus without complications: Secondary | ICD-10-CM | POA: Diagnosis not present

## 2022-12-11 DIAGNOSIS — I5022 Chronic systolic (congestive) heart failure: Secondary | ICD-10-CM | POA: Diagnosis not present

## 2022-12-11 LAB — BASIC METABOLIC PANEL
BUN/Creatinine Ratio: 22 (ref 10–24)
BUN: 23 mg/dL (ref 8–27)
CO2: 26 mmol/L (ref 20–29)
Calcium: 9.9 mg/dL (ref 8.6–10.2)
Chloride: 99 mmol/L (ref 96–106)
Creatinine, Ser: 1.04 mg/dL (ref 0.76–1.27)
Glucose: 110 mg/dL — ABNORMAL HIGH (ref 70–99)
Potassium: 5.1 mmol/L (ref 3.5–5.2)
Sodium: 139 mmol/L (ref 134–144)
eGFR: 77 mL/min/{1.73_m2} (ref >59)

## 2022-12-12 ENCOUNTER — Other Ambulatory Visit: Payer: Self-pay

## 2022-12-12 MED ORDER — FUROSEMIDE 20 MG PO TABS: 20.0000 mg | ORAL_TABLET | Freq: Every day | ORAL | 1 refills | Status: DC

## 2022-12-15 NOTE — Progress Notes (Deleted)
Cardiology Office Note:    Date:  12/15/2022   ID:  EWEN VARNELL, DOB [birth year], MRN [id number]  PCP:  London Pepper, MD  Cardiologist:  Minus Breeding, MD  Electrophysiologist:  None   Referring MD: London Pepper, MD   Chief Complaint: follow-up of CHF and shortness of breath  History of Present Illness:    Benjamin Erickson is a 72 y.o. male with a history of CAD noted on cardiac catheterization in 09/2022 (medical therapy recommended), chronic HFrEF with EF of 30-35%, hypertension, hyperlipidemia, and type 2 diabetes mellitus on insulin who is followed by Dr. Percival Spanish and presents today for follow-up of CHF and shortness of breath.  Patient was referred to Dr. Percival Spanish in 02/2020 for further evaluation of chest pain. Coronary CTA was ordered and showed a coronary calcium score of 1,805 Agatston units (94th percentile for age and sex) and extensive coronary plaque but FFR was unable to be processes due to poor quality/ artifact. ETT was then ordered in 06/2020 and was considered intermediate risk due to decreased exercise effort but there was no evidence of ischemic changes on EKG. At visit in 06/2022, he reported progressive shortness of breath and reported one episode of chest discomfort several weeks prior. Cardiac PET was ordered. This was completed in 08/2022 and was high risk. It should findings consistent with a prior MI as well as severely reduced EF and severe coronary calcifications. Echo was then ordered and showed LVEF of 30-35% with global hypokinesis and grade 1 diastolic dysfunction. He ultimately had a cardiac catheterization in 09/2022 which showed 60% stenosis of the mid LAD, 95% stenosis of the 1st Diag, and 80% stenosis of the RPDA as well as diffuse coronary ectasia. Medical therapy was recommended.   Patient was recently seen by Mayra Reel, NP, on 11/23/2022 at which time he reported improvement in his fatigue but continued shortness of breath and described  orthopnea and PND. However, weight was stable and he denied any edema. He was started on Lasix '20mg'$  daily for 3 days and then was instructed to increase Entresto to 49-51 mg twice daily after these three days.  Patient presents today for follow-up. ***  Chronic HFrEF Echo in 09/2022 showed LVEF of 30-35% with global hypokinesis and grade 1 diastolic dysfunction. Patient reported continued shortness of breath as well as orthopnea and PND at office visit earlier this month. He was started on a short course of Lasix and then Entresto was increased.  - *** - Continue Lasix '20mg'$  daily.  - Continue Entresto 49-'51mg'$  twice dailiy. - Continue Coreg 6.'25mg'$  twice daily. - Spironolactone or SGLT2 inhibitor *** - Discussed importance of daily weight and sodium/fluid restrictions. - Will repeat Echo in ***  CAD LHC in 09/2022 showed 60% stenosis of the mid LAD, 95% stenosis of the 1st Diag, and 80% stenosis of the RPDA as well as diffuse coronary ectasia. Medical therapy was recommended.  - No chest pain.  - Continue Aspirin and Crestor/ Praluent.  Hypertension BP *** - Continue medications for CHF as above.  Hyperlipidemia Lipid panel in 06/2022: Total Cholesterol 144, Triglycerides 381, HDL 56, LDL 46. Lipoprotein (a) <8.4.  - Continue Crestor '10mg'$  daily and Praluent.  Type 2 Diabetes Mellitus Hemoglobin A1c *** - On Metformin.  - Management per PCP.  Past Medical History:  Diagnosis Date   Bigeminy    DM (diabetes mellitus) ([place])    Frequent PVCs    Hyperlipidemia    Hypertension    Nocturia  Past Surgical History:  Procedure Laterality Date   HERNIA REPAIR     Inguinal    LEFT HEART CATH AND CORONARY ANGIOGRAPHY N/A 10/16/2022   Procedure: LEFT HEART CATH AND CORONARY ANGIOGRAPHY;  Surgeon: Martinique, Peter M, MD;  Location: [place] [facility];  Service: Cardiovascular;  Laterality: N/A;    Current Medications: No outpatient medications have been marked as taking for the  12/26/22 encounter (Appointment) with Darreld Mclean, PA-C.     Allergies:   Lidocaine and Sulfa antibiotics   Social History   Socioeconomic History   Marital status: Married    Spouse name: Not on file   Number of children: Not on file   Years of education: Not on file   Highest education level: Not on file  Occupational History   Not on file  Tobacco Use   Smoking status: Former    Types: Cigarettes   Smokeless tobacco: Never   Tobacco comments:    Quit 40 years ago  Substance and Sexual Activity   Alcohol use: Not Currently   Drug use: Not Currently   Sexual activity: Yes    Partners: Female  Other Topics Concern   Not on file  Social History Narrative   Not on file   Social Determinants of Health   Financial Resource Strain: Not on file  Food Insecurity: Not on file  Transportation Needs: Not on file  Physical Activity: Not on file  Stress: Not on file  Social Connections: Not on file     Family History: The patient's family history includes CAD in his father; Heart failure in his father; Skin cancer in his mother; Throat cancer in his father.  ROS:   Please see the history of present illness.     EKGs/Labs/Other Studies Reviewed:    The following studies were reviewed:  Coronary CTA 03/24/2020: Impressions: 1. Coronary artery calcium score 1805 Agatston units. This places the patient in the 94th percentile for age and gender, suggesting high risk for future cardiac events. 2. Extensive coronary plaque. I suspect that there is no stenosis >50%, but will send for FFR to confirm. _______________  Exercise Tolerance Test 06/23/2020: There was no ST segment deviation noted during stress. Total exercise time 3 minutes and 33 seconds-shortness of breath No ST segment deviation Overall intermediate risk exercise treadmill test secondary to decreased exercise effort. Overall no ischemic changes noted on ECG. _______________  Cardiac PET 09/18/2022:   LV  perfusion is abnormal. There is no evidence of ischemia. There is evidence of infarction. Defect 1: There is a small defect with mild reduction in uptake present in the apical to mid inferior location(s) that is fixed. There is abnormal wall motion in the defect area. Consistent with infarction.   Rest left ventricular function is abnormal. Rest global function is severely reduced. Rest EF: 22 %. Stress left ventricular function is abnormal. Stress global function is severely reduced. Stress EF: 13 %. End diastolic cavity size is normal. End systolic cavity size is moderately enlarged.   Myocardial blood flow was computed to be 0.75m/g/min at rest and 1.867mg/min at stress. Global myocardial blood flow reserve was 1.85 and was mildly abnormal.   Coronary calcium was present on the attenuation correction CT images. Severe coronary calcifications were present. Coronary calcifications were present in the left anterior descending artery, left circumflex artery and right coronary artery distribution(s).   Findings are consistent with prior myocardial infarction. The study is high risk.   Small fixed inferior perfusion  defect consistent with infarct.  However, systolic function is severely reduced, and in setting of severe coronary calcifications and decrease in EF with stress and decreased myocardial blood flow reserve, recommend cardiac catheterization to evaluate for severe multivessel CAD.  If no obstructive coronary disease on cath, suspect decreased myocardial blood flow is due to microvascular disease.  Would also recommend echocardiogram to evaluate systolic function. _______________  Echocardiogram 09/28/2022: Impressions: 1. Mild hypokinesis of the anteroseptal/inferoseptal wall. Left  ventricular ejection fraction, by estimation, is 30 to 35%. The left  ventricle has moderately decreased function. The left ventricle  demonstrates global hypokinesis. Left ventricular  diastolic parameters are  consistent with Grade I diastolic dysfunction  (impaired relaxation).   2. Right ventricular systolic function is normal. The right ventricular  size is normal. Tricuspid regurgitation signal is inadequate for assessing  PA pressure.   3. The mitral valve is normal in structure. No evidence of mitral valve  regurgitation.   4. Aortic valve calcification. Cannot rule out small vegetation on the  [place] (image 4). Aortic valve regurgitation is not visualized.   5. There is mild dilatation of the ascending aorta, measuring 39 mm.   6. The inferior vena cava is normal in size with greater than 50%  respiratory variability, suggesting right atrial pressure of 3 mmHg.  _______________  Left Cardiac Catheterization 10/16/2022:   Mid LAD lesion is 60% stenosed.   1st Diag lesion is 95% stenosed.   RPDA lesion is 80% stenosed.   LV end diastolic pressure is normal.   Diffuse coronary ectasia. No significant obstruction in the major epicardial vessels. There is obstructive disease in a very small diagonal branch and the mid to distal PDA Normal LVEDP   Plan: medical therapy.  Diagnostic Dominance: Right     EKG:  EKG not ordered today.   Recent Labs: 07/18/2022: ALT 19 10/09/2022: Hemoglobin 15.4; Platelets 397 12/11/2022: BUN 23; Creatinine, Ser 1.04; Potassium 5.1; Sodium 139  Recent Lipid Panel    Component Value Date/Time   CHOL 144 07/18/2022 0825   TRIG 381 (H) 07/18/2022 0825   HDL 42 07/18/2022 0825   CHOLHDL 3.4 07/18/2022 0825   LDLCALC 46 07/18/2022 0825    Physical Exam:    Vital Signs: There were no vitals taken for this visit.    Wt Readings from Last 3 Encounters:  11/23/22 231 lb 12.8 oz (105.1 kg)  10/30/22 230 lb (104.3 kg)  10/09/22 231 lb 9.6 oz (105.1 kg)     General: 72 y.o. male in no acute distress. HEENT: Normocephalic and atraumatic. Sclera clear. EOMs intact. Neck: Supple. No carotid bruits. No JVD. Heart: *** RRR. Distinct S1 and S2. No murmurs,  gallops, or rubs. Radial and distal pedal pulses 2+ and equal bilaterally. Lungs: No increased work of breathing. Clear to ausculation bilaterally. No wheezes, rhonchi, or rales.  Abdomen: Soft, non-distended, and non-tender to palpation. Bowel sounds present in all 4 quadrants.  MSK: Normal strength and tone for age. *** Extremities: No lower extremity edema.    Skin: Warm and dry. Neuro: Alert and oriented x3. No focal deficits. Psych: Normal affect. Responds appropriately.   Assessment:    No diagnosis found.  Plan:     Disposition: Follow up in ***   Medication Adjustments/Labs and Tests Ordered: Current medicines are reviewed at length with the patient today.  Concerns regarding medicines are outlined above.  No orders of the defined types were placed in this encounter.  No orders of the defined  types were placed in this encounter.   There are no Patient Instructions on file for this visit.   Signed, Darreld Mclean, PA-C  12/15/2022 10:55 AM    [place]

## 2022-12-22 ENCOUNTER — Other Ambulatory Visit: Payer: Self-pay | Admitting: Cardiology

## 2022-12-22 DIAGNOSIS — E785 Hyperlipidemia, unspecified: Secondary | ICD-10-CM

## 2022-12-22 DIAGNOSIS — I2510 Atherosclerotic heart disease of native coronary artery without angina pectoris: Secondary | ICD-10-CM

## 2022-12-25 ENCOUNTER — Other Ambulatory Visit: Payer: Self-pay | Admitting: Cardiology

## 2022-12-25 ENCOUNTER — Telehealth: Payer: Self-pay

## 2022-12-25 DIAGNOSIS — E785 Hyperlipidemia, unspecified: Secondary | ICD-10-CM

## 2022-12-25 DIAGNOSIS — I2510 Atherosclerotic heart disease of native coronary artery without angina pectoris: Secondary | ICD-10-CM

## 2022-12-25 NOTE — Telephone Encounter (Signed)
Pharmacy Patient Advocate Encounter  Prior Authorization for REPATHA 140 MG/ML INJ has been approved.    Effective dates: 11/19/22 through 11/19/23   Received notification from [facility] that prior authorization for REPATHA 140 MG/ML INJ is needed.    PA submitted on 12/25/22 Key [id number] Status is pending  Karie Soda, [place] Patient Advocate Specialist Direct Number: [phone] Fax: [phone]

## 2022-12-25 NOTE — Progress Notes (Unsigned)
Cardiology Clinic Note   Patient Name: Benjamin Erickson Date of Encounter: 12/26/2022  Primary Care Provider:  London Pepper, MD Primary Cardiologist:  Minus Breeding, MD  Patient Profile    Benjamin Erickson is a 72 y.o. male with a past medical history of CAD per angiography no PCI/medical therapy, chronic systolic heart failure, hypertension, hyperlipidemia, insulin-dependent diabetes who presents to the clinic today for 1 month follow-up.  Past Medical History    Past Medical History:  Diagnosis Date   Bigeminy    DM (diabetes mellitus) ([place])    Frequent PVCs    Hyperlipidemia    Hypertension    Nocturia    Past Surgical History:  Procedure Laterality Date   HERNIA REPAIR     Inguinal    LEFT HEART CATH AND CORONARY ANGIOGRAPHY N/A 10/16/2022   Procedure: LEFT HEART CATH AND CORONARY ANGIOGRAPHY;  Surgeon: Martinique, Peter M, MD;  Location: [facility];  Service: Cardiovascular;  Laterality: N/A;    Allergies  Allergies  Allergen Reactions   Lidocaine Hives   Sulfa Antibiotics Hives    History of Present Illness    Benjamin Erickson has a past medical history of: CAD.  Coronary CT 03/24/2020: Calcium score 1805 (94 percentile). Aortic atherosclerosis. FFR could not be performed secondary to artifact.  Exercise stress test 06/23/2020: Intermediate risk secondary to decreased exercise effort. No ischemic changes noted.  PET CT 09/18/2022: High risk study. Small fixed interior perfusion defect. Systolic function severely reduced. Recommend LHC.  LHC 10/16/2022: Mid LAD 60%, 1st diag 95%, RPDA 80%. Diffuse coronary ectasia. No significant obstruction in major epicardial vessels. Obstructive disease in very small diagonal branch and mid to distal PDA. Recommend medical therapy.  Chronic systolic heart failure.  Echo 09/28/2022: EF 30-35%. Mild hypokinesis anteroseptal/inferoseptal wall. Global hypokinesis. Grade I DD. Aortic valve calcification. Cannot rule out small  vegetation on the [place]. 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  Hypertension.  Hyperlipidemia.  Lipid panel 07/18/2022: LDL 45, HDL 42, TG 381, total 144. Insulin dependent diabetes.    Benjamin Erickson was initially seen by Dr. Percival Spanish on 03/01/2020 for chest pain. Coronary CT as above. Exercise stress test August 2021 showed no ischemic changes. When patient was seen in August 2023 he complained of progressive shortness of breath and some chest discomfort. PET CT October 2023 was a high risk study and LHC was recommended. Systolic function was also found to be severely reduced. Echo showed EF 30-35%, hypokinesis, grade I DD. LHC November 2023 showed obstructive disease in small diagnoal branch and mid to distal PDA. Medical therapy was recommended.    Patient was last seen in the office on 11/23/2022  At that time he reported continued shortness of breath and PND 3-4 times a week.  He denied edema and reported stable weight.  No chest pain.  He was given a 3-day trial of Lasix to see if shortness of breath improved.  After completing trial of Lasix he was to increase Entresto to 49/51 mg. Lasix was extended to daily.   Today, patient is feeling somewhat improved. He finds his breathing is the worst in the morning. By 3 pm he feels his breathing is close to normal and that continues for the rest of the night. He has never felt he was holding fluid. He denies lower extremity edema, abdominal bloating/tightness or early satiety.  However his weight is down approximately 5 lb and his daily weight has been stable between 224 to 225  pounds.  His fatigue has continued and he does not feel it has improved much. No chest pain, pressure, or tightness. Denies lower extremity edema, orthopnea, or PND. No palpitations.  He continues dietary indiscretion particularly with salt.  Again went over the importance of sodium restriction.  He states he is working on it and will do better.    Home Medications    Current Meds   Medication Sig   acetaminophen (TYLENOL) 325 MG tablet Take 650 mg by mouth every 6 (six) hours as needed for moderate pain.   Alirocumab (PRALUENT) 150 MG/ML SOAJ INJECT 150 MG INTO THE SKIN EVERY 14 (FOURTEEN) DAYS.   aspirin EC 81 MG tablet Take 81 mg by mouth daily. Swallow whole.   carvedilol (COREG) 6.25 MG tablet Take 1 tablet (6.25 mg total) by mouth 2 (two) times daily.   furosemide (LASIX) 20 MG tablet Take 1 tablet (20 mg total) by mouth daily.   metFORMIN (GLUCOPHAGE) 500 MG tablet Take 1 tablet (500 mg total) by mouth 2 (two) times daily.   rosuvastatin (CRESTOR) 10 MG tablet TAKE 1 TABLET BY MOUTH ONCE A WEEK.   sacubitril-valsartan (ENTRESTO) 49-51 MG Take 1 tablet by mouth 2 (two) times daily.   tamsulosin (FLOMAX) 0.4 MG CAPS capsule Take 0.4 mg by mouth daily.    Family History    Family History  Problem Relation Age of Onset   Skin cancer Mother    Throat cancer Father    Heart failure Father    CAD Father    He indicated that his mother is deceased. He indicated that his father is deceased. He indicated that both of his sisters are alive. He indicated that his brother is alive. He indicated that his maternal grandmother is deceased. He indicated that his maternal grandfather is deceased. He indicated that his paternal grandmother is deceased. He indicated that his paternal grandfather is deceased.   Social History    Social History   Socioeconomic History   Marital status: Married    Spouse name: Not on file   Number of children: Not on file   Years of education: Not on file   Highest education level: Not on file  Occupational History   Not on file  Tobacco Use   Smoking status: Former    Types: Cigarettes   Smokeless tobacco: Never   Tobacco comments:    Quit 40 years ago  Substance and Sexual Activity   Alcohol use: Not Currently   Drug use: Not Currently   Sexual activity: Yes    Partners: Female  Other Topics Concern   Not on file  Social  History Narrative   Not on file   Social Determinants of Health   Financial Resource Strain: Not on file  Food Insecurity: Not on file  Transportation Needs: Not on file  Physical Activity: Not on file  Stress: Not on file  Social Connections: Not on file  Intimate Partner Violence: Not on file     Review of Systems    General:  No chills, fever, night sweats or weight changes.  Positive for fatigue. Cardiovascular:  No chest pain, edema, orthopnea, palpitations, paroxysmal nocturnal dyspnea.  Positive for shortness of breath and DOE particularly in the morning. Dermatological: No rash, lesions/masses Respiratory: No cough, dyspnea Urologic: No hematuria, dysuria Abdominal:   No nausea, vomiting, diarrhea, bright red blood per rectum, melena, or hematemesis Neurologic:  No visual changes, weakness, changes in mental status. All other systems reviewed and are  otherwise negative except as noted above.  Physical Exam    VS:  BP (!) 144/80   Pulse (!) 56   Ht '5\' 9"'$  (1.753 m)   Wt 226 lb 6.4 oz (102.7 kg)   SpO2 94%   BMI 33.43 kg/m  , BMI Body mass index is 33.43 kg/m. GEN:  Well nourished, well developed, in no acute distress. HEENT: Normal. Neck: Supple, no JVD, carotid bruits, or masses. Cardiac: RRR, no murmurs, rubs, or gallops. No clubbing, cyanosis, edema.  Radials/DP/PT 2+ and equal bilaterally.  Respiratory:  Respirations regular and unlabored, clear to auscultation bilaterally. GI: Soft, nontender, nondistended. MS: No deformity or atrophy. Skin: Warm and dry, no rash. Neuro: Strength and sensation are intact. Psych: Normal affect.  Accessory Clinical Findings   Recent Labs: 07/18/2022: ALT 19 10/09/2022: Hemoglobin 15.4; Platelets 397 12/11/2022: BUN 23; Creatinine, Ser 1.04; Potassium 5.1; Sodium 139   Recent Lipid Panel    Component Value Date/Time   CHOL 144 07/18/2022 0825   TRIG 381 (H) 07/18/2022 0825   HDL 42 07/18/2022 0825   CHOLHDL 3.4  07/18/2022 0825   LDLCALC 46 07/18/2022 0825     ECG is not indicated today.   Assessment & Plan   CAD.  [place] November 2023 showed struct of disease and small first diagonal PDA and nonobstructive disease in LAD.  Medical therapy was recommended.  Patient denies chest pain, tightness, pressure. Continue aspirin, Praluent, and Crestor.  Chronic systolic heart failure.  NYHA II-III.  Echo November 2023 showed EF 30 to 35%, Grade I DD.  Patient reports 3-day trial of Lasix improved his breathing.  He has began taking the Lasix daily and feels better.  Breathing is at its worst in the mornings but by 3 PM it is close to normal and continues that way throughout the night.  He denies orthopnea or PND.  Weight is down 5 pounds since starting Lasix and is now stable between 224-225 pounds.  Lungs are clear to auscultation.  Euvolemic and well compensated on exam.  Continue Lasix, carvedilol and Entresto.  Added [place].  Will get repeat echo and BMP in 3 to 4 weeks. Hypertension.  BP today 144/80 at intake and 132/78 at recheck.  Patient denies headaches or dizziness.  Continue Entresto and carvedilol. Hyperlipidemia.  LDL August 2023 45, at goal.  Patient is statin intolerant.  Continue Praluent, and Crestor once a week.  Disposition: Continue daily Lasix.  Add Farxiga 10 mg daily.  Echo and BMP in 3 to 4 weeks.  Return in 6 weeks or sooner as needed.   Justice Britain. Cordarryl Monrreal, DNP, NP-C     12/26/2022, 10:23 AM [address] Office [phone] Fax [phone]

## 2022-12-25 NOTE — Telephone Encounter (Signed)
-----   Message from Rollen Sox, [facility] sent at 12/25/2022 10:42 AM EST ----- Regarding: repatha Can one of you complete the Repatha PA please? Plan no longer pays for Praluent

## 2022-12-26 ENCOUNTER — Ambulatory Visit: Payer: PPO | Attending: Student | Admitting: Student

## 2022-12-26 ENCOUNTER — Encounter: Payer: Self-pay | Admitting: Student

## 2022-12-26 VITALS — BP 132/78 | HR 56 | Ht 69.0 in | Wt 226.4 lb

## 2022-12-26 DIAGNOSIS — I10 Essential (primary) hypertension: Secondary | ICD-10-CM

## 2022-12-26 DIAGNOSIS — I5022 Chronic systolic (congestive) heart failure: Secondary | ICD-10-CM

## 2022-12-26 DIAGNOSIS — Z79899 Other long term (current) drug therapy: Secondary | ICD-10-CM | POA: Diagnosis not present

## 2022-12-26 DIAGNOSIS — E785 Hyperlipidemia, unspecified: Secondary | ICD-10-CM

## 2022-12-26 DIAGNOSIS — I2510 Atherosclerotic heart disease of native coronary artery without angina pectoris: Secondary | ICD-10-CM

## 2022-12-26 MED ORDER — DAPAGLIFLOZIN PROPANEDIOL 10 MG PO TABS: 10.0000 mg | ORAL_TABLET | Freq: Every day | ORAL | 3 refills | Status: DC

## 2022-12-26 NOTE — Patient Instructions (Addendum)
Medication Instructions:   START Farxiga 10 mg daily   *If you need a refill on your cardiac medications before your next appointment, please call your pharmacy*  Lab Work: Your physician recommends that you return for lab work in 3-4 weeks:  BMP If you have labs (blood work) drawn today and your tests are completely normal, you will receive your results only by: [place] (if you have MyChart) OR A paper copy in the mail If you have any lab test that is abnormal or we need to change your treatment, we will call you to review the results.  Testing/Procedures: Your physician has requested that you have an echocardiogram. Echocardiography is a painless test that uses sound waves to create images of your heart. It provides your doctor with information about the size and shape of your heart and how well your heart's chambers and valves are working. This procedure takes approximately one hour. There are no restrictions for this procedure. Please do NOT wear cologne, perfume, aftershave, or lotions (deodorant is allowed). Please arrive 15 minutes prior to your appointment time.  Please schedule for 3-5 weeks   Follow-Up: At [facility], you and your health needs are our priority.  As part of our continuing mission to provide you with exceptional heart care, we have created designated Provider Care Teams.  These Care Teams include your primary Cardiologist (physician) and Advanced Practice Providers (APPs -  Physician Assistants and Nurse Practitioners) who all work together to provide you with the care you need, when you need it.  Your next appointment:   6 week(s)  Provider:   Mayra Reel, NP        Other Instructions

## 2022-12-28 NOTE — Telephone Encounter (Signed)
Called pt to advise him of med change from Praluent to [place]. His wife states he was able to pick up Praluent the other day without issue and that they got a notice from his insurance that [place] was approved.  Advised that's fine for pt to continue on Praluent as long as pharmacy is able to fill, and that if he has any issues with refills in the future, to call us and we'll send in rx for Repatha as PA was approved for this through 11/19/23. She was appreciative for the call.

## 2023-01-09 DIAGNOSIS — I2510 Atherosclerotic heart disease of native coronary artery without angina pectoris: Secondary | ICD-10-CM | POA: Diagnosis not present

## 2023-01-09 DIAGNOSIS — R972 Elevated prostate specific antigen [PSA]: Secondary | ICD-10-CM | POA: Diagnosis not present

## 2023-01-09 DIAGNOSIS — I700 Atherosclerosis of aorta: Secondary | ICD-10-CM | POA: Diagnosis not present

## 2023-01-09 DIAGNOSIS — E669 Obesity, unspecified: Secondary | ICD-10-CM | POA: Diagnosis not present

## 2023-01-09 DIAGNOSIS — I5022 Chronic systolic (congestive) heart failure: Secondary | ICD-10-CM | POA: Diagnosis not present

## 2023-01-09 DIAGNOSIS — E1140 Type 2 diabetes mellitus with diabetic neuropathy, unspecified: Secondary | ICD-10-CM | POA: Diagnosis not present

## 2023-01-09 DIAGNOSIS — E785 Hyperlipidemia, unspecified: Secondary | ICD-10-CM | POA: Diagnosis not present

## 2023-01-14 DIAGNOSIS — Z79899 Other long term (current) drug therapy: Secondary | ICD-10-CM | POA: Diagnosis not present

## 2023-01-15 ENCOUNTER — Ambulatory Visit (INDEPENDENT_AMBULATORY_CARE_PROVIDER_SITE_OTHER): Payer: PPO

## 2023-01-15 DIAGNOSIS — I5022 Chronic systolic (congestive) heart failure: Secondary | ICD-10-CM

## 2023-01-15 LAB — BASIC METABOLIC PANEL
BUN/Creatinine Ratio: 17 (ref 10–24)
BUN: 20 mg/dL (ref 8–27)
CO2: 22 mmol/L (ref 20–29)
Calcium: 9.9 mg/dL (ref 8.6–10.2)
Chloride: 101 mmol/L (ref 96–106)
Creatinine, Ser: 1.18 mg/dL (ref 0.76–1.27)
Glucose: 103 mg/dL — ABNORMAL HIGH (ref 70–99)
Potassium: 5.1 mmol/L (ref 3.5–5.2)
Sodium: 139 mmol/L (ref 134–144)
eGFR: 66 mL/min/{1.73_m2} (ref >59)

## 2023-01-15 MED ORDER — PERFLUTREN LIPID MICROSPHERE: 1.0000 mL | INTRAVENOUS | Status: AC | PRN

## 2023-01-15 MED ORDER — PERFLUTREN LIPID MICROSPHERE
1.0000 mL | INTRAVENOUS | Status: AC | PRN
  Administered 2023-01-15: 1 mL via INTRAVENOUS

## 2023-01-16 ENCOUNTER — Other Ambulatory Visit: Payer: Self-pay | Admitting: Cardiology

## 2023-01-16 LAB — ECHOCARDIOGRAM COMPLETE
Area-P 1/2: 3.53 cm2
S' Lateral: 3.87 cm

## 2023-01-22 ENCOUNTER — Other Ambulatory Visit: Payer: Self-pay | Admitting: Student

## 2023-01-26 ENCOUNTER — Other Ambulatory Visit: Payer: Self-pay

## 2023-01-26 ENCOUNTER — Encounter (HOSPITAL_COMMUNITY): Payer: Self-pay

## 2023-01-26 ENCOUNTER — Emergency Department (HOSPITAL_COMMUNITY): Payer: PPO

## 2023-01-26 ENCOUNTER — Inpatient Hospital Stay (HOSPITAL_COMMUNITY)
Admission: EM | Admit: 2023-01-26 | Discharge: 2023-01-28 | DRG: 280 | Disposition: A | Discharge status: Home / Self Care | Payer: PPO | Attending: Cardiology | Admitting: Cardiology

## 2023-01-26 DIAGNOSIS — R079 Chest pain, unspecified: Principal | ICD-10-CM

## 2023-01-26 DIAGNOSIS — Z7982 Long term (current) use of aspirin: Secondary | ICD-10-CM

## 2023-01-26 DIAGNOSIS — R7989 Other specified abnormal findings of blood chemistry: Secondary | ICD-10-CM | POA: Diagnosis present

## 2023-01-26 DIAGNOSIS — Z888 Allergy status to other drugs, medicaments and biological substances status: Secondary | ICD-10-CM | POA: Diagnosis not present

## 2023-01-26 DIAGNOSIS — E118 Type 2 diabetes mellitus with unspecified complications: Secondary | ICD-10-CM | POA: Diagnosis not present

## 2023-01-26 DIAGNOSIS — E785 Hyperlipidemia, unspecified: Secondary | ICD-10-CM | POA: Diagnosis present

## 2023-01-26 DIAGNOSIS — Z7984 Long term (current) use of oral hypoglycemic drugs: Secondary | ICD-10-CM

## 2023-01-26 DIAGNOSIS — Z79899 Other long term (current) drug therapy: Secondary | ICD-10-CM

## 2023-01-26 DIAGNOSIS — I21A1 Myocardial infarction type 2: Secondary | ICD-10-CM | POA: Diagnosis not present

## 2023-01-26 DIAGNOSIS — I428 Other cardiomyopathies: Secondary | ICD-10-CM | POA: Diagnosis not present

## 2023-01-26 DIAGNOSIS — I493 Ventricular premature depolarization: Secondary | ICD-10-CM | POA: Diagnosis not present

## 2023-01-26 DIAGNOSIS — I5042 Chronic combined systolic (congestive) and diastolic (congestive) heart failure: Secondary | ICD-10-CM | POA: Diagnosis present

## 2023-01-26 DIAGNOSIS — Z808 Family history of malignant neoplasm of other organs or systems: Secondary | ICD-10-CM | POA: Diagnosis not present

## 2023-01-26 DIAGNOSIS — I214 Non-ST elevation (NSTEMI) myocardial infarction: Secondary | ICD-10-CM | POA: Diagnosis not present

## 2023-01-26 DIAGNOSIS — Z8249 Family history of ischemic heart disease and other diseases of the circulatory system: Secondary | ICD-10-CM

## 2023-01-26 DIAGNOSIS — R778 Other specified abnormalities of plasma proteins: Secondary | ICD-10-CM | POA: Diagnosis not present

## 2023-01-26 DIAGNOSIS — I10 Essential (primary) hypertension: Secondary | ICD-10-CM | POA: Diagnosis present

## 2023-01-26 DIAGNOSIS — I5022 Chronic systolic (congestive) heart failure: Secondary | ICD-10-CM | POA: Diagnosis not present

## 2023-01-26 DIAGNOSIS — R0789 Other chest pain: Secondary | ICD-10-CM | POA: Diagnosis not present

## 2023-01-26 DIAGNOSIS — E781 Pure hyperglyceridemia: Secondary | ICD-10-CM | POA: Diagnosis not present

## 2023-01-26 DIAGNOSIS — Z882 Allergy status to sulfonamides status: Secondary | ICD-10-CM | POA: Diagnosis not present

## 2023-01-26 DIAGNOSIS — E669 Obesity, unspecified: Secondary | ICD-10-CM | POA: Diagnosis present

## 2023-01-26 DIAGNOSIS — I5043 Acute on chronic combined systolic (congestive) and diastolic (congestive) heart failure: Secondary | ICD-10-CM | POA: Diagnosis present

## 2023-01-26 DIAGNOSIS — I25110 Atherosclerotic heart disease of native coronary artery with unstable angina pectoris: Secondary | ICD-10-CM | POA: Diagnosis not present

## 2023-01-26 DIAGNOSIS — Z6833 Body mass index (BMI) 33.0-33.9, adult: Secondary | ICD-10-CM | POA: Diagnosis not present

## 2023-01-26 DIAGNOSIS — I110 Hypertensive heart disease with heart failure: Secondary | ICD-10-CM | POA: Diagnosis not present

## 2023-01-26 DIAGNOSIS — I2510 Atherosclerotic heart disease of native coronary artery without angina pectoris: Secondary | ICD-10-CM | POA: Diagnosis not present

## 2023-01-26 LAB — CBC
HCT: 46.1 % (ref 39.0–52.0)
Hemoglobin: 15.4 g/dL (ref 13.0–17.0)
MCH: 31.5 pg (ref 26.0–34.0)
MCHC: 33.4 g/dL (ref 30.0–36.0)
MCV: 94.3 fL (ref 80.0–100.0)
Platelets: 391 10*3/uL (ref 150–400)
RBC: 4.89 MIL/uL (ref 4.22–5.81)
RDW: 12.9 % (ref 11.5–15.5)
WBC: 9.3 10*3/uL (ref 4.0–10.5)
nRBC: 0 % (ref 0.0–0.2)

## 2023-01-26 LAB — BASIC METABOLIC PANEL
Anion gap: 15 (ref 5–15)
BUN: 23 mg/dL (ref 8–23)
CO2: 20 mmol/L — ABNORMAL LOW (ref 22–32)
Calcium: 9.4 mg/dL (ref 8.9–10.3)
Chloride: 103 mmol/L (ref 98–111)
Creatinine, Ser: 1.25 mg/dL — ABNORMAL HIGH (ref 0.61–1.24)
GFR, Estimated: >60 mL/min (ref >60)
Glucose, Bld: 135 mg/dL — ABNORMAL HIGH (ref 70–99)
Potassium: 4.8 mmol/L (ref 3.5–5.1)
Sodium: 138 mmol/L (ref 135–145)

## 2023-01-26 LAB — HEPATIC FUNCTION PANEL
ALT: 20 U/L (ref 0–44)
AST: 16 U/L (ref 15–41)
Albumin: 3.8 g/dL (ref 3.5–5.0)
Alkaline Phosphatase: 41 U/L (ref 38–126)
Bilirubin, Direct: <0.1 mg/dL (ref 0.0–0.2)
Total Bilirubin: 0.6 mg/dL (ref 0.3–1.2)
Total Protein: 7 g/dL (ref 6.5–8.1)

## 2023-01-26 LAB — TROPONIN I (HIGH SENSITIVITY)
Troponin I (High Sensitivity): 5 ng/L (ref <18)
Troponin I (High Sensitivity): 120 ng/L (ref <18)

## 2023-01-26 LAB — BRAIN NATRIURETIC PEPTIDE: B Natriuretic Peptide: 25.2 pg/mL (ref 0.0–100.0)

## 2023-01-26 LAB — HEPARIN LEVEL (UNFRACTIONATED): Heparin Unfractionated: 0.16 IU/mL — ABNORMAL LOW (ref 0.30–0.70)

## 2023-01-26 MED ORDER — HEPARIN BOLUS VIA INFUSION
2700.0000 [IU] | Freq: Once | INTRAVENOUS | Status: AC
  Administered 2023-01-26: 2700 [IU] via INTRAVENOUS
  Filled 2023-01-26: qty 2700

## 2023-01-26 MED ORDER — ASPIRIN 81 MG PO TBEC
81.0000 mg | DELAYED_RELEASE_TABLET | Freq: Every day | ORAL | Status: DC
  Administered 2023-01-27 – 2023-01-28 (×2): 81 mg via ORAL
  Filled 2023-01-26 (×2): qty 1

## 2023-01-26 MED ORDER — SACUBITRIL-VALSARTAN 49-51 MG PO TABS
1.0000 | ORAL_TABLET | Freq: Two times a day (BID) | ORAL | Status: DC
  Administered 2023-01-26 – 2023-01-28 (×4): 1 via ORAL
  Filled 2023-01-26 (×4): qty 1

## 2023-01-26 MED ORDER — NITROGLYCERIN 0.4 MG SL SUBL: 0.4000 mg | SUBLINGUAL_TABLET | SUBLINGUAL | Status: DC | PRN

## 2023-01-26 MED ORDER — ACETAMINOPHEN 325 MG PO TABS: 650.0000 mg | ORAL_TABLET | ORAL | Status: DC | PRN

## 2023-01-26 MED ORDER — ROSUVASTATIN CALCIUM 20 MG PO TABS
20.0000 mg | ORAL_TABLET | Freq: Every day | ORAL | Status: DC
  Administered 2023-01-26 – 2023-01-27 (×2): 20 mg via ORAL
  Filled 2023-01-26 (×2): qty 1

## 2023-01-26 MED ORDER — HEPARIN (PORCINE) 25000 UT/250ML-% IV SOLN
1400.0000 [IU]/h | INTRAVENOUS | Status: DC
  Administered 2023-01-26: 1100 [IU]/h via INTRAVENOUS
  Administered 2023-01-27 (×2): 1400 [IU]/h via INTRAVENOUS
  Filled 2023-01-26 (×3): qty 250

## 2023-01-26 MED ORDER — HEPARIN BOLUS VIA INFUSION
4000.0000 [IU] | Freq: Once | INTRAVENOUS | Status: AC
  Administered 2023-01-26: 4000 [IU] via INTRAVENOUS
  Filled 2023-01-26: qty 4000

## 2023-01-26 MED ORDER — MELATONIN 3 MG PO TABS
3.0000 mg | ORAL_TABLET | Freq: Every evening | ORAL | Status: DC | PRN
  Administered 2023-01-26: 3 mg via ORAL
  Filled 2023-01-26: qty 1

## 2023-01-26 MED ORDER — ASPIRIN 300 MG RE SUPP: 300.0000 mg | RECTAL | Status: AC

## 2023-01-26 MED ORDER — DAPAGLIFLOZIN PROPANEDIOL 10 MG PO TABS
10.0000 mg | ORAL_TABLET | Freq: Every day | ORAL | Status: DC
  Administered 2023-01-27 – 2023-01-28 (×2): 10 mg via ORAL
  Filled 2023-01-26 (×2): qty 1

## 2023-01-26 MED ORDER — CARVEDILOL 6.25 MG PO TABS
6.2500 mg | ORAL_TABLET | Freq: Two times a day (BID) | ORAL | Status: DC
  Administered 2023-01-26 – 2023-01-27 (×2): 6.25 mg via ORAL
  Filled 2023-01-26 (×2): qty 1

## 2023-01-26 MED ORDER — SPIRONOLACTONE 12.5 MG HALF TABLET
12.5000 mg | ORAL_TABLET | Freq: Every day | ORAL | Status: DC
  Administered 2023-01-26 – 2023-01-28 (×3): 12.5 mg via ORAL
  Filled 2023-01-26 (×3): qty 1

## 2023-01-26 MED ORDER — TAMSULOSIN HCL 0.4 MG PO CAPS
0.4000 mg | ORAL_CAPSULE | Freq: Every day | ORAL | Status: DC
  Administered 2023-01-27 – 2023-01-28 (×2): 0.4 mg via ORAL
  Filled 2023-01-26 (×3): qty 1

## 2023-01-26 MED ORDER — ONDANSETRON HCL 4 MG/2ML IJ SOLN: 4.0000 mg | Freq: Four times a day (QID) | INTRAMUSCULAR | Status: DC | PRN

## 2023-01-26 MED ORDER — ASPIRIN 81 MG PO CHEW
324.0000 mg | CHEWABLE_TABLET | ORAL | Status: AC
  Administered 2023-01-26: 324 mg via ORAL
  Filled 2023-01-26: qty 4

## 2023-01-26 NOTE — ED Notes (Signed)
ED TO INPATIENT HANDOFF REPORT  ED Nurse Name and Phone #: Vikki Ports [phone]  S Name/Age/Gender Benjamin Erickson 72 y.o. male Room/Bed: 045C/045C  Code Status   Code Status: Full Code  Home/SNF/Other Home Patient oriented to: self, place, time, and situation Is this baseline? Yes   Triage Complete: Triage complete  Chief Complaint NSTEMI (non-ST elevated myocardial infarction) [facility]) [I21.4]  Triage Note Pt c/o sternal chest pain since 1730. Marland Kitchen Pt describes as pressure developing into pain, then back to pressure. Pt states he has hx of CHF and endorses SHOB. Denies N/V.   Allergies Allergies  Allergen Reactions   Lidocaine Hives   Sulfa Antibiotics Hives    Level of Care/Admitting Diagnosis ED Disposition     ED Disposition  Admit   Condition  --   Comment  Hospital Area: [facility] [[id number]]  Level of Care: Telemetry Cardiac [103]  May admit patient to [facility] or [facility] if equivalent level of care is available:: No  Covid Evaluation: Asymptomatic - no recent exposure (last 10 days) testing not required  Diagnosis: NSTEMI (non-ST elevated myocardial infarction) [facility]) [id number]  Admitting Physician: Geralynn Rile [id number]  Attending Physician: Geralynn Rile [id number]  Certification:: I certify this patient will need inpatient services for at least 2 midnights  Estimated Length of Stay: 3          B Medical/Surgery History Past Medical History:  Diagnosis Date   Bigeminy    DM (diabetes mellitus) ([place])    Frequent PVCs    Hyperlipidemia    Hypertension    Nocturia    Past Surgical History:  Procedure Laterality Date   HERNIA REPAIR     Inguinal    LEFT HEART CATH AND CORONARY ANGIOGRAPHY N/A 10/16/2022   Procedure: LEFT HEART CATH AND CORONARY ANGIOGRAPHY;  Surgeon: Martinique, Peter M, MD;  Location: [facility];  Service: Cardiovascular;  Laterality: N/A;     A IV Location/Drains/Wounds Patient  Lines/Drains/Airways Status     Active Line/Drains/Airways     Name Placement date Placement time Site Days   Peripheral IV 01/26/23 20 G Right Antecubital 01/26/23  0859  Antecubital  less than 1            Intake/Output Last 24 hours  Intake/Output Summary (Last 24 hours) at 01/26/2023 1446 Last data filed at 01/26/2023 1247 Gross per 24 hour  Intake 47.28 ml  Output --  Net 47.28 ml    Labs/Imaging Results for orders placed or performed during the hospital encounter of 01/26/23 (from the past 48 hour(s))  Basic metabolic panel     Status: Abnormal   Collection Time: 01/26/23  8:55 AM  Result Value Ref Range   Sodium 138 135 - 145 mmol/L   Potassium 4.8 3.5 - 5.1 mmol/L   Chloride 103 98 - 111 mmol/L   CO2 20 (L) 22 - 32 mmol/L   Glucose, Bld 135 (H) 70 - 99 mg/dL    Comment: Glucose reference range applies only to samples taken after fasting for at least 8 hours.   BUN 23 8 - 23 mg/dL   Creatinine, Ser 1.25 (H) 0.61 - 1.24 mg/dL   Calcium 9.4 8.9 - 10.3 mg/dL   GFR, Estimated >60 >60 mL/min    Comment: (NOTE) Calculated using the CKD-EPI Creatinine Equation (2021)    Anion gap 15 5 - 15    Comment: Performed at [facility] [address] [place], [place]  [id number]  CBC     Status: None   Collection Time: 01/26/23  8:55 AM  Result Value Ref Range   WBC 9.3 4.0 - 10.5 K/uL   RBC 4.89 4.22 - 5.81 MIL/uL   Hemoglobin 15.4 13.0 - 17.0 g/dL   HCT 46.1 39.0 - 52.0 %   MCV 94.3 80.0 - 100.0 fL   MCH 31.5 26.0 - 34.0 pg   MCHC 33.4 30.0 - 36.0 g/dL   RDW 12.9 11.5 - 15.5 %   Platelets 391 150 - 400 K/uL   nRBC 0.0 0.0 - 0.2 %    Comment: Performed at [facility], [place] [address], [place], [place] [address]  Troponin I (High Sensitivity)     Status: None   Collection Time: 01/26/23  8:55 AM  Result Value Ref Range   Troponin I (High Sensitivity) 5 <18 ng/L    Comment: (NOTE) Elevated high sensitivity troponin I (hsTnI) values and significant  changes  across serial measurements may suggest ACS but many other  chronic and acute conditions are known to elevate hsTnI results.  Refer to the "Links" section for chest pain algorithms and additional  guidance. Performed at [facility], [place] [address], [place], [place] [address]   Hepatic function panel     Status: None   Collection Time: 01/26/23 10:46 AM  Result Value Ref Range   Total Protein 7.0 6.5 - 8.1 g/dL   Albumin 3.8 3.5 - 5.0 g/dL   AST 16 15 - 41 U/L   ALT 20 0 - 44 U/L   Alkaline Phosphatase 41 38 - 126 U/L   Total Bilirubin 0.6 0.3 - 1.2 mg/dL   Bilirubin, Direct <0.1 0.0 - 0.2 mg/dL   Indirect Bilirubin NOT CALCULATED 0.3 - 0.9 mg/dL    Comment: Performed at [facility] [address], [place], [place] [address]  Troponin I (High Sensitivity)     Status: Abnormal   Collection Time: 01/26/23 10:46 AM  Result Value Ref Range   Troponin I (High Sensitivity) 120 (HH) <18 ng/L    Comment: CRITICAL RESULT CALLED TO, READ BACK BY AND VERIFIED WITH M,MCIVER RN '@1124'$  01/26/23 E,[place] (NOTE) Elevated high sensitivity troponin I (hsTnI) values and significant  changes across serial measurements may suggest ACS but many other  chronic and acute conditions are known to elevate hsTnI results.  Refer to the "Links" section for chest pain algorithms and additional  guidance. Performed at [facility], [place] [address] [place], [place] [address]    DG Chest 2 View  Result Date: 01/26/2023 CLINICAL DATA:  73 year old male with chest pain since 1730 hours. EXAM: CHEST - 2 VIEW COMPARISON:  Cardiac CT 03/24/2020 and earlier. FINDINGS: PA and lateral views at 0921 hours. 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 Other mediastinal contours are within normal limits. Stable lung volumes. Visualized tracheal air column is within normal limits. Both lungs appear clear. No pneumothorax or pleural effusion. No acute osseous  abnormality identified. Negative visible bowel gas. IMPRESSION: No acute cardiopulmonary abnormality. 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 Electronically Signed   By: Genevie Ann M.D.   On: 01/26/2023 09:38    Pending Labs Unresulted Labs (From admission, onward)     Start     Ordered   01/27/23 0500  Heparin level (unfractionated)  Daily,   R     See Hyperspace for full Linked Orders  Report.   01/26/23 1154   01/27/23 0500  CBC  Daily,   R     See Hyperspace for full Linked Orders Report.   01/26/23 1154   01/27/23 [id number]  Basic metabolic panel  Tomorrow morning,   R        01/26/23 1256   01/27/23 0500  Lipid panel  Tomorrow morning,   R        01/26/23 1256   01/27/23 0500  CBC  Tomorrow morning,   R        01/26/23 1256   01/27/23 0500  TSH  Tomorrow morning,   R        01/26/23 1311   01/26/23 2000  Heparin level (unfractionated)  Once-Timed,   URGENT        01/26/23 1154   01/26/23 0910  Brain natriuretic peptide  Add-on,   AD        01/26/23 0909            Vitals/Pain Today's Vitals   01/26/23 1145 01/26/23 1200 01/26/23 1215 01/26/23 1229  BP: (!) 106/58 (!) 108/56 109/84   Pulse: (!) 53 (!) 52 (!) 53   Resp: '16 18 19   '$ Temp:    97.7 F (36.5 C)  TempSrc:      SpO2: 98% 98% 99%   Weight:      Height:        Isolation Precautions No active isolations  Medications Medications  heparin ADULT infusion 100 units/mL (25000 units/29m) (1,100 Units/hr Intravenous Infusion Verify 01/26/23 1247)  aspirin EC tablet 81 mg (has no administration in time range)  nitroGLYCERIN (NITROSTAT) SL tablet 0.4 mg (has no administration in time range)  acetaminophen (TYLENOL) tablet 650 mg (has no administration in time range)  ondansetron (ZOFRAN) injection 4 mg (has no administration in time range)  carvedilol (COREG) tablet 6.25 mg (has no administration in time range)  dapagliflozin propanediol (FARXIGA) tablet 10 mg (has no administration in time range)   rosuvastatin (CRESTOR) tablet 20 mg (has no administration in time range)  sacubitril-valsartan (ENTRESTO) 49-51 mg per tablet (has no administration in time range)  tamsulosin (FLOMAX) capsule 0.4 mg (0.4 mg Oral Not Given 01/26/23 1346)  spironolactone (ALDACTONE) tablet 12.5 mg (12.5 mg Oral Given 01/26/23 1345)  heparin bolus via infusion 4,000 Units (4,000 Units Intravenous Bolus from Bag 01/26/23 1205)  aspirin chewable tablet 324 mg (324 mg Oral Given 01/26/23 1345)    Or  aspirin suppository 300 mg ( Rectal See Alternative 01/26/23 1345)    Mobility walks     Focused Assessments Cardiac Assessment Handoff:  Cardiac Rhythm: Normal sinus rhythm No results found for: "CKTOTAL", "CKMB", "CKMBINDEX", "TROPONINI" No results found for: "DDIMER" Does the Patient currently have chest pain? No    R Recommendations: See Admitting Provider Note  Report given to:   Additional Notes:

## 2023-01-26 NOTE — ED Triage Notes (Signed)
Pt c/o sternal chest pain since 1730. Marland Kitchen Pt describes as pressure developing into pain, then back to pressure. Pt states he has hx of CHF and endorses SHOB. Denies N/V.

## 2023-01-26 NOTE — Progress Notes (Signed)
ANTICOAGULATION CONSULT NOTE - follow-up  Pharmacy Consult for heparin  Indication: chest pain/ACS  Allergies  Allergen Reactions   Lidocaine Hives   Sulfa Antibiotics Hives    Patient Measurements: Height: '5\' 9"'$  (175.3 cm) Weight: 100.1 kg (220 lb 10.9 oz) IBW/kg (Calculated) : 70.7 Heparin Dosing Weight: 92.3kg   Vital Signs: Temp: 97.9 F (36.6 C) (03/09 1930) Temp Source: Oral (03/09 1930) BP: 115/57 (03/09 1930) Pulse Rate: 77 (03/09 1930)  Labs: Recent Labs    01/26/23 0855 01/26/23 1046 01/26/23 1955  HGB 15.4  --   --   HCT 46.1  --   --   PLT 391  --   --   HEPARINUNFRC  --   --  0.16*  CREATININE 1.25*  --   --   TROPONINIHS 5 120*  --      Estimated Creatinine Clearance: 63.3 mL/min (A) (by C-G formula based on SCr of 1.25 mg/dL (H)).   Medical History: Past Medical History:  Diagnosis Date   Bigeminy    DM (diabetes mellitus) ([place])    Frequent PVCs    Hyperlipidemia    Hypertension    Nocturia    Assessment: Patient presenting with CC of chest pain. Trop elevated to 120. No anticoagulation prior to admission. Pharmacy consulted to dose heparin.   3/9 PM update: HL 0.16- subtherapeutic No signs of bleeding No issues with the infusion  Goal of Therapy:  Heparin level 0.3-0.7 units/ml Monitor platelets by anticoagulation protocol: Yes   Plan:  Give 2700 units bolus x 1 Start heparin infusion at 1400 units/hr Check anti-Xa level in 8 hours and daily while on heparin Continue to monitor H&H and platelets  Omah Dewalt BS, PharmD, BCPS Clinical Pharmacist 01/26/2023 8:46 PM  Contact: [phone] after 3 PM  "Be curious, not judgmental..." -Jamal Maes

## 2023-01-26 NOTE — H&P (Signed)
Cardiology Admission History and Physical:  Patient ID: Benjamin Erickson MRN: [id number] DOB: [birth year]  Admit date: 01/26/2023  Primary Care Provider: London Pepper, MD Primary Cardiologist: Minus Breeding, MD  Primary Electrophysiologist:  None   Chief Complaint:  Chest pain  Patient Profile:  Benjamin Erickson is a 72 y.o. male with CAD, chronic systolic heart failure, hypertension, hyperlipidemia who presents with non-STEMI.  History of Present Illness:  Benjamin Erickson reports symptoms of chest discomfort over the past 48 hours.  He describes tightness in his chest as well as pressure.  Symptoms are occurring at rest.  He reports he is currently chest pain-free.  They resolved once he got to the emergency room.  He reports no fevers or chills.  Denies any shortness of breath.  Known history of a nonischemic cardiomyopathy.  He does have recent left heart catheterization with obstructive disease.  In the emergency room blood pressure is stable.  Labs notable for serum creatinine of 1.25.  Hemoglobin 15.4.  Initial troponin 5.  Trending up to 120.  Chest x-ray negative.  Echo on 01/16/2023 with LVEF 30-35% which is chronic.  No signs of volume overload today on examination.  His exam is normal.  EKG demonstrates sinus rhythm heart rate 59 with nonspecific IVCD, single PVC noted.  Heart Pathway Score:       Past Medical History: Past Medical History:  Diagnosis Date   Bigeminy    DM (diabetes mellitus) ([place])    Frequent PVCs    Hyperlipidemia    Hypertension    Nocturia     Past Surgical History: Past Surgical History:  Procedure Laterality Date   HERNIA REPAIR     Inguinal    LEFT HEART CATH AND CORONARY ANGIOGRAPHY N/A 10/16/2022   Procedure: LEFT HEART CATH AND CORONARY ANGIOGRAPHY;  Surgeon: Martinique, Peter M, MD;  Location: [facility];  Service: Cardiovascular;  Laterality: N/A;     Medications Prior to Admission: Prior to Admission medications   Medication Sig  Start Date End Date Taking? Authorizing Provider  acetaminophen (TYLENOL) 325 MG tablet Take 650 mg by mouth every 6 (six) hours as needed for moderate pain.    [provider]  Alirocumab (PRALUENT) 150 MG/ML SOAJ INJECT 150 MG INTO THE SKIN EVERY 14 (FOURTEEN) DAYS. 12/25/22   Minus Breeding, MD  aspirin EC 81 MG tablet Take 81 mg by mouth daily. Swallow whole.    [provider]  carvedilol (COREG) 6.25 MG tablet TAKE 1 TABLET BY MOUTH TWICE A DAY 01/22/23   Minus Breeding, MD  dapagliflozin propanediol (FARXIGA) 10 MG TABS tablet Take 1 tablet (10 mg total) by mouth daily before breakfast. 12/26/22   Mayra Reel, NP  furosemide (LASIX) 20 MG tablet Take 1 tablet (20 mg total) by mouth daily. 12/12/22 02/10/23  Mayra Reel, NP  metFORMIN (GLUCOPHAGE) 500 MG tablet Take 1 tablet (500 mg total) by mouth 2 (two) times daily. 10/19/22   Martinique, Peter M, MD  rosuvastatin (CRESTOR) 10 MG tablet TAKE 1 TABLET BY MOUTH ONE TIME PER WEEK 01/16/23   Minus Breeding, MD  sacubitril-valsartan (ENTRESTO) 49-51 MG Take 1 tablet by mouth 2 (two) times daily. 11/26/22   Mayra Reel, NP  tamsulosin (FLOMAX) 0.4 MG CAPS capsule Take 0.4 mg by mouth daily. 05/23/21   [provider]     Allergies:    Allergies  Allergen Reactions   Lidocaine Hives   Sulfa Antibiotics Hives    Social History:  Social History   Socioeconomic History   Marital status: Married    Spouse name: Not on file   Number of children: Not on file   Years of education: Not on file   Highest education level: Not on file  Occupational History   Not on file  Tobacco Use   Smoking status: Former    Types: Cigarettes   Smokeless tobacco: Never   Tobacco comments:    Quit 40 years ago  Substance and Sexual Activity   Alcohol use: Not Currently   Drug use: Not Currently   Sexual activity: Yes    Partners: Female  Other Topics Concern   Not on file  Social History Narrative   Not on  file   Social Determinants of Health   Financial Resource Strain: Not on file  Food Insecurity: Not on file  Transportation Needs: Not on file  Physical Activity: Not on file  Stress: Not on file  Social Connections: Not on file  Intimate Partner Violence: Not on file     Family History:   The patient's family history includes CAD in his father; Heart failure in his father; Skin cancer in his mother; Throat cancer in his father.    ROS:  All other ROS reviewed and negative. Pertinent positives noted in the HPI.     Physical Exam/Data:   Vitals:   01/26/23 1145 01/26/23 1200 01/26/23 1215 01/26/23 1229  BP: (!) 106/58 (!) 108/56 109/84   Pulse: (!) 53 (!) 52 (!) 53   Resp: '16 18 19   '$ Temp:    97.7 F (36.5 C)  TempSrc:      SpO2: 98% 98% 99%   Weight:      Height:        Intake/Output Summary (Last 24 hours) at 01/26/2023 1311 Last data filed at 01/26/2023 1247 Gross per 24 hour  Intake 47.28 ml  Output --  Net 47.28 ml       01/26/2023    8:44 AM 12/26/2022   10:19 AM 11/23/2022    3:06 PM  Last 3 Weights  Weight (lbs) 224 lb 226 lb 6.4 oz 231 lb 12.8 oz  Weight (kg) 101.606 kg 102.694 kg 105.144 kg    Body mass index is 33.08 kg/m.  General: Well nourished, well developed, in no acute distress Head: Atraumatic, normal size  Eyes: PEERLA, EOMI  Neck: Supple, no JVD Endocrine: No thryomegaly Cardiac: Normal S1, S2; RRR; no murmurs, rubs, or gallops Lungs: Clear to auscultation bilaterally, no wheezing, rhonchi or rales  Abd: Soft, nontender, no hepatomegaly  Ext: No edema, pulses 2+ Musculoskeletal: No deformities, BUE and BLE strength normal and equal Skin: Warm and dry, no rashes   Neuro: Alert and oriented to person, place, time, and situation, CNII-XII grossly intact, no focal deficits  Psych: Normal mood and affect   EKG:  The ECG that was done and was personally reviewed and demonstrates normal sinus rhythm heart rate 59, nonspecific IVCD, no acute ischemic  changes  Relevant CV Studies: TTE 01/16/2023  1. Technically difficult study, poor visualization even with contrast  administration. Left ventricular ejection fraction, by estimation, is 30  to 35%. The left ventricle has moderately decreased function. Left  ventricular endocardial border not  optimally defined to evaluate regional wall motion. There is mild left  ventricular hypertrophy. Left ventricular diastolic parameters are  consistent with Grade I diastolic dysfunction (impaired relaxation).   2. Right ventricular systolic function is mildly reduced. The right  ventricular size is normal. There is normal pulmonary artery systolic  pressure. The estimated right ventricular systolic pressure is [id number] mmHg.   3. The mitral valve is normal in structure. No evidence of mitral valve  regurgitation. No evidence of mitral stenosis.   4. The aortic valve is tricuspid. Aortic valve regurgitation is not  visualized. Aortic valve sclerosis/calcification is present, without any  evidence of aortic stenosis.   5. Aortic dilatation noted. There is dilatation of the ascending aorta,  measuring 40 mm.   6. The inferior vena cava is normal in size with greater than 50%  respiratory variability, suggesting right atrial pressure of 3 mmHg.   LHC 10/16/2022 Mid LAD lesion is 60% stenosed.   1st Diag lesion is 95% stenosed.   RPDA lesion is 80% stenosed.   LV end diastolic pressure is normal.   Diffuse coronary ectasia. No significant obstruction in the major epicardial vessels. There is obstructive disease in a very small diagonal branch and the mid to distal PDA Normal LVEDP   Laboratory Data: High Sensitivity Troponin:   Recent Labs  Lab 01/26/23 0855 01/26/23 1046  TROPONINIHS 5 120*      Cardiac EnzymesNo results for input(s): "TROPONINI" in the last 168 hours. No results for input(s): "TROPIPOC" in the last 168 hours.  Chemistry Recent Labs  Lab 01/26/23 0855  NA 138  K 4.8  CL  103  CO2 20*  GLUCOSE 135*  BUN 23  CREATININE 1.25*  CALCIUM 9.4  GFRNONAA >60  ANIONGAP 15    Recent Labs  Lab 01/26/23 1046  PROT 7.0  ALBUMIN 3.8  AST 16  ALT 20  ALKPHOS 41  BILITOT 0.6   Hematology Recent Labs  Lab 01/26/23 0855  WBC 9.3  RBC 4.89  HGB 15.4  HCT 46.1  MCV 94.3  MCH 31.5  MCHC 33.4  RDW 12.9  PLT 391   BNPNo results for input(s): "BNP", "PROBNP" in the last 168 hours.  DDimer No results for input(s): "DDIMER" in the last 168 hours.  Radiology/Studies:  DG Chest 2 View  Result Date: 01/26/2023 CLINICAL DATA:  72 year old male with chest pain since 1730 hours. EXAM: CHEST - 2 VIEW COMPARISON:  Cardiac CT 03/24/2020 and earlier. FINDINGS: PA and lateral views at 0921 hours. 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 Other mediastinal contours are within normal limits. Stable lung volumes. Visualized tracheal air column is within normal limits. Both lungs appear clear. No pneumothorax or pleural effusion. No acute osseous abnormality identified. Negative visible bowel gas. IMPRESSION: No acute cardiopulmonary abnormality. 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 Electronically Signed   By: Genevie Ann M.D.   On: 01/26/2023 09:38    Assessment and Plan:   #NSTEMI -Left heart catheterization November 2023 with 60% mid LAD, 95% D1 and 80% RPDA lesion.  These were managed medically. -Now with recurrent chest symptoms at rest.  Troponins are elevated.  He has ruled in for an acute coronary syndrome.  Currently chest pain-free.  Plan for left heart catheterization Monday. -Continue aspirin and heparin drip. -On a beta-blocker.  Sublingual nitro as needed.  Add Imdur if needed. -EF noted to be 30-35%.  We will recheck a limited echo while he is here.  # Chronic systolic heart failure, EF 30-35% -No signs of volume overload.  Quite stable.  Continue Coreg 6.25 mg twice daily.  Farxiga 10 mg  daily, Entresto 49-51 mg twice daily.  Add  Aldactone 12.5 mg daily. -Seems to have a plan for outpatient cardiac MRI.  # Diabetes -Controlled with diet.  # Hyperlipidemia -Continue high intensity statin.  LDL was at goal.  Recheck tomorrow.  FEN -No intravenous fluids -DVT PPx: Heparin drip -Code: Full -Diet: Heart healthy -Disposition: Pending left heart catheterization Monday  Severity of Illness: The appropriate patient status for this patient is INPATIENT. Inpatient status is judged to be reasonable and necessary in order to provide the required intensity of service to ensure the patient's safety. The patient's presenting symptoms, physical exam findings, and initial radiographic and laboratory data in the context of their chronic comorbidities is felt to place them at high risk for further clinical deterioration. Furthermore, it is not anticipated that the patient will be medically stable for discharge from the hospital within 2 midnights of admission.   * I certify that at the point of admission it is my clinical judgment that the patient will require inpatient hospital care spanning beyond 2 midnights from the point of admission due to high intensity of service, high risk for further deterioration and high frequency of surveillance required.*   For questions or updates, please contact Benjamin Erickson Please consult [email or web address] for contact info under     Signed, Benjamin Bells T. Audie Box, MD, [place]  01/26/2023 1:11 PM

## 2023-01-26 NOTE — ED Provider Notes (Signed)
[facility] Provider Note   CSN: [id number] Arrival date & time: 01/26/23  [id number]     History  Chief Complaint  Patient presents with   Chest Pain    Benjamin Erickson is a 72 y.o. male.  The history is provided by the patient and medical records. No language interpreter was used.  Chest Pain Pain location:  Substernal area Pain quality: aching, crushing and pressure   Pain radiates to:  Does not radiate Pain severity:  Moderate Onset quality:  Gradual Duration:  1 day Timing:  Constant Progression:  Waxing and waning Chronicity:  New Context: at rest   Relieved by:  Nothing Worsened by:  Nothing Ineffective treatments:  None tried Associated symptoms: shortness of breath   Associated symptoms: no abdominal pain, no back pain, no claudication, no cough, no diaphoresis, no fatigue, no fever, no headache, no heartburn, no lower extremity edema, no nausea, no near-syncope, no palpitations and no vomiting   Risk factors: coronary artery disease        Home Medications Prior to Admission medications   Medication Sig Start Date End Date Taking? Authorizing Provider  acetaminophen (TYLENOL) 325 MG tablet Take 650 mg by mouth every 6 (six) hours as needed for moderate pain.    [provider]  Alirocumab (PRALUENT) 150 MG/ML SOAJ INJECT 150 MG INTO THE SKIN EVERY 14 (FOURTEEN) DAYS. 12/25/22   Minus Breeding, MD  aspirin EC 81 MG tablet Take 81 mg by mouth daily. Swallow whole.    [provider]  carvedilol (COREG) 6.25 MG tablet TAKE 1 TABLET BY MOUTH TWICE A DAY 01/22/23   Minus Breeding, MD  dapagliflozin propanediol (FARXIGA) 10 MG TABS tablet Take 1 tablet (10 mg total) by mouth daily before breakfast. 12/26/22   Mayra Reel, NP  furosemide (LASIX) 20 MG tablet Take 1 tablet (20 mg total) by mouth daily. 12/12/22 02/10/23  Mayra Reel, NP  metFORMIN (GLUCOPHAGE) 500 MG tablet Take 1 tablet (500 mg  total) by mouth 2 (two) times daily. 10/19/22   Martinique, Peter M, MD  rosuvastatin (CRESTOR) 10 MG tablet TAKE 1 TABLET BY MOUTH ONE TIME PER WEEK 01/16/23   Minus Breeding, MD  sacubitril-valsartan (ENTRESTO) 49-51 MG Take 1 tablet by mouth 2 (two) times daily. 11/26/22   Mayra Reel, NP  tamsulosin (FLOMAX) 0.4 MG CAPS capsule Take 0.4 mg by mouth daily. 05/23/21   [provider]      Allergies    Lidocaine and Sulfa antibiotics    Review of Systems   Review of Systems  Constitutional:  Negative for chills, diaphoresis, fatigue and fever.  HENT:  Negative for congestion.   Respiratory:  Positive for shortness of breath. Negative for cough, chest tightness and wheezing.   Cardiovascular:  Positive for chest pain. Negative for palpitations, claudication, leg swelling and near-syncope.  Gastrointestinal:  Negative for abdominal pain, constipation, diarrhea, heartburn, nausea and vomiting.  Genitourinary:  Negative for flank pain.  Musculoskeletal:  Negative for back pain.  Neurological:  Negative for headaches.  Psychiatric/Behavioral:  Negative for agitation.   All other systems reviewed and are negative.   Physical Exam Updated Vital Signs BP (!) 144/57 (BP Location: Right Arm)   Pulse (!) 58   Temp 97.7 F (36.5 C) (Oral)   Resp 17   Ht '5\' 9"'$  (1.753 m)   Wt 101.6 kg   SpO2 98%   BMI 33.08 kg/m  Physical Exam Vitals and nursing note  reviewed.  Constitutional:      General: He is not in acute distress.    Appearance: He is well-developed. He is not ill-appearing, toxic-appearing or diaphoretic.  HENT:     Head: Normocephalic and atraumatic.  Eyes:     Conjunctiva/sclera: Conjunctivae normal.  Cardiovascular:     Rate and Rhythm: Normal rate and regular rhythm.     Heart sounds: Normal heart sounds. No murmur heard. Pulmonary:     Effort: Pulmonary effort is normal. No respiratory distress.     Breath sounds: Normal breath sounds. No decreased breath  sounds, wheezing, rhonchi or rales.  Chest:     Chest wall: No tenderness.  Abdominal:     Palpations: Abdomen is soft.     Tenderness: There is no abdominal tenderness.  Musculoskeletal:        General: No swelling.     Cervical back: Neck supple.     Right lower leg: No tenderness. No edema.     Left lower leg: No tenderness. No edema.  Skin:    General: Skin is warm and dry.     Capillary Refill: Capillary refill takes less than 2 seconds.     Findings: No erythema.  Neurological:     General: No focal deficit present.     Mental Status: He is alert.  Psychiatric:        Mood and Affect: Mood normal.     ED Results / Procedures / Treatments   Labs (all labs ordered are listed, but only abnormal results are displayed) Labs Reviewed  BASIC METABOLIC PANEL - Abnormal; Notable for the following components:      Result Value   CO2 20 (*)    Glucose, Bld 135 (*)    Creatinine, Ser 1.25 (*)    All other components within normal limits  TROPONIN I (HIGH SENSITIVITY) - Abnormal; Notable for the following components:   Troponin I (High Sensitivity) 120 (*)    All other components within normal limits  CBC  HEPATIC FUNCTION PANEL  BRAIN NATRIURETIC PEPTIDE  TROPONIN I (HIGH SENSITIVITY)    EKG EKG Interpretation  Date/Time:  Saturday January 26 2023 09:17:02 EST Ventricular Rate:  59 PR Interval:  242 QRS Duration: 127 QT Interval:  479 QTC Calculation: 475 R Axis:   -48 Text Interpretation: Sinus rhythm Ventricular premature complex Prolonged PR interval Nonspecific IVCD with LAD Consider anterior infarct when compared to prior, similar appearance with slower rate. No STEMI Confirmed by Antony Blackbird [phone]) on 01/26/2023 9:31:51 AM  Radiology DG Chest 2 View  Result Date: 01/26/2023 CLINICAL DATA:  72 year old male with chest pain since 1730 hours. EXAM: CHEST - 2 VIEW COMPARISON:  Cardiac CT 03/24/2020 and earlier. FINDINGS: PA and lateral views at 0921 hours. Chronic  elevation or eventration of the right hemidiaphragm appears stable from the 2021 CT. Other mediastinal contours are within normal limits. Stable lung volumes. Visualized tracheal air column is within normal limits. Both lungs appear clear. No pneumothorax or pleural effusion. No acute osseous abnormality identified. Negative visible bowel gas. IMPRESSION: No acute cardiopulmonary abnormality. 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 Electronically Signed   By: Genevie Ann M.D.   On: 01/26/2023 09:38    Procedures Procedures    CRITICAL CARE Performed by: Gwenyth Allegra Augustina Braddock Total critical care time: 20 minutes Critical care time was exclusive of separately billable procedures and treating other patients. Critical care was necessary to treat or prevent imminent or life-threatening deterioration. Critical care  was time spent personally by me on the following activities: development of treatment plan with patient and/or surrogate as well as nursing, discussions with consultants, evaluation of patient's response to treatment, examination of patient, obtaining history from patient or surrogate, ordering and performing treatments and interventions, ordering and review of laboratory studies, ordering and review of radiographic studies, pulse oximetry and re-evaluation of patient's condition.  Medications Ordered in ED Medications - No data to display  ED Course/ Medical Decision Making/ A&P                             Medical Decision Making Amount and/or Complexity of Data Reviewed Labs: ordered. Radiology: ordered.  Risk Prescription drug management. Decision regarding hospitalization.    JAKEL CROUCH is a 72 y.o. male with a past medical history significant for CAD currently being medically managed, CHF with most recent EF last month of 30 to 35%, hypertension, hyperlipidemia, diabetes, and previous bigeminy who presents with chest pressure and shortness of breath  since yesterday.  According to patient, he has heart failure and has had some mild shortness of breath recently but yesterday around 530 developed chest pressure.  He reports it was pressure then went to pain then back to pressure.  He says he was able to use a sleep aid and get to sleep overnight but this morning it was still having the chest pressure so he decided to come here for evaluation.  He says it feels different than the epigastric symptoms he had when he was found to have a mild heart attack in the past.  He reports he is not someone who gets fluid in his legs but is concerned about possible heart failure worsening with his shortness of breath.  He denies any palpitations, nausea, vomiting, or any infectious symptoms such as fevers, chills, congestion, or cough.  Denies constipation, diarrhea, or urinary changes.  Denies trauma.  Denies spicy foods.  Pain does not radiate or go down his arm or towards his jaw.  It is primarily pressure in his central chest.  He does not think it is exertional or pleuritic.  On exam, lungs were clear and chest was nontender.  Abdomen nontender.  Good pulses in extremities.  Legs are nontender and nonedematous.  Patient resting comfortably.  EKG shows no STEMI.  Clinically I do feel need to rule out large exacerbation versus NSTEMI or other cardiac cause of his pressure-like chest discomfort and shortness of breath.  Will get screening workup including chest x-ray, labs, including troponin and BNP, then will likely touch base with cardiology to determine disposition.  10:02 AM This troponin not elevated.  CBC and metabolic panel reassuring with creatinine of 1.25.   Hepatic function BNP still in process.  Without pneumonia or pneumothorax.  Called Dr. Audie Box with cardiology who reviewed the case.  He feels that if the patient has a reassuring second troponin, he is likely safe for discharge home with close cardiology follow-up and starting Imdur at 30 mg daily.   If troponin is rising or significant elevated, will speak to Dr. Audie Box again.  Spoke to cardiology who recommended heparinization they will see for admission.  Patient agrees with this plan.        Final Clinical Impression(s) / ED Diagnoses Final diagnoses:  Chest pain, unspecified type  Elevated troponin level     Clinical Impression: 1. Chest pain, unspecified type   2. Elevated troponin level  Disposition: Admit  This note was prepared with assistance of Systems analyst. Occasional wrong-word or sound-a-like substitutions may have occurred due to the inherent limitations of voice recognition software.     Jacquees Gongora, Gwenyth Allegra, MD 01/26/23 1409

## 2023-01-26 NOTE — Progress Notes (Signed)
ANTICOAGULATION CONSULT NOTE - Initial Consult  Pharmacy Consult for heparin  Indication: chest pain/ACS  Allergies  Allergen Reactions   Lidocaine Hives   Sulfa Antibiotics Hives    Patient Measurements: Height: '5\' 9"'$  (175.3 cm) Weight: 101.6 kg (224 lb) IBW/kg (Calculated) : 70.7 Heparin Dosing Weight: 92.3kg   Vital Signs: Temp: 97.7 F (36.5 C) (03/09 0845) Temp Source: Oral (03/09 0845) BP: 116/60 (03/09 1130) Pulse Rate: 54 (03/09 1130)  Labs: Recent Labs    01/26/23 0855 01/26/23 1046  HGB 15.4  --   HCT 46.1  --   PLT 391  --   CREATININE 1.25*  --   TROPONINIHS 5 120*    Estimated Creatinine Clearance: 63.7 mL/min (A) (by C-G formula based on SCr of 1.25 mg/dL (H)).   Medical History: Past Medical History:  Diagnosis Date   Bigeminy    DM (diabetes mellitus) ([place])    Frequent PVCs    Hyperlipidemia    Hypertension    Nocturia    Assessment: Patient presenting with CC of chest pain. Trop elevated to 120. No anticoagulation prior to admission. Pharmacy consulted to dose heparin.   Goal of Therapy:  Heparin level 0.3-0.7 units/ml Monitor platelets by anticoagulation protocol: Yes   Plan:  Give 4000 units bolus x 1 Start heparin infusion at 1100 units/hr Check anti-Xa level in 8 hours and daily while on heparin Continue to monitor H&H and platelets  Esmeralda Arthur, PharmD, BCCCP  01/26/2023,11:51 AM

## 2023-01-27 ENCOUNTER — Inpatient Hospital Stay (HOSPITAL_COMMUNITY): Payer: PPO

## 2023-01-27 DIAGNOSIS — I5022 Chronic systolic (congestive) heart failure: Secondary | ICD-10-CM | POA: Diagnosis not present

## 2023-01-27 DIAGNOSIS — I21A1 Myocardial infarction type 2: Secondary | ICD-10-CM | POA: Diagnosis not present

## 2023-01-27 DIAGNOSIS — I214 Non-ST elevation (NSTEMI) myocardial infarction: Secondary | ICD-10-CM | POA: Diagnosis not present

## 2023-01-27 LAB — CBC
HCT: 44 % (ref 39.0–52.0)
Hemoglobin: 14.2 g/dL (ref 13.0–17.0)
MCH: 30.6 pg (ref 26.0–34.0)
MCHC: 32.3 g/dL (ref 30.0–36.0)
MCV: 94.8 fL (ref 80.0–100.0)
Platelets: 377 10*3/uL (ref 150–400)
RBC: 4.64 MIL/uL (ref 4.22–5.81)
RDW: 13.2 % (ref 11.5–15.5)
WBC: 10.8 10*3/uL — ABNORMAL HIGH (ref 4.0–10.5)
nRBC: 0 % (ref 0.0–0.2)

## 2023-01-27 LAB — BASIC METABOLIC PANEL
Anion gap: 9 (ref 5–15)
BUN: 23 mg/dL (ref 8–23)
CO2: 24 mmol/L (ref 22–32)
Calcium: 9.1 mg/dL (ref 8.9–10.3)
Chloride: 104 mmol/L (ref 98–111)
Creatinine, Ser: 1.18 mg/dL (ref 0.61–1.24)
GFR, Estimated: >60 mL/min (ref >60)
Glucose, Bld: 131 mg/dL — ABNORMAL HIGH (ref 70–99)
Potassium: 3.9 mmol/L (ref 3.5–5.1)
Sodium: 137 mmol/L (ref 135–145)

## 2023-01-27 LAB — ECHOCARDIOGRAM LIMITED
Est EF: 50
Height: 69 in
Weight: 3527.36 oz

## 2023-01-27 LAB — HEPARIN LEVEL (UNFRACTIONATED)
Heparin Unfractionated: 0.52 IU/mL (ref 0.30–0.70)
Heparin Unfractionated: 0.41 IU/mL (ref 0.30–0.70)

## 2023-01-27 LAB — LIPID PANEL
Cholesterol: 143 mg/dL (ref 0–200)
HDL: 32 mg/dL — ABNORMAL LOW (ref >40)
LDL Cholesterol: 57 mg/dL (ref 0–99)
Total CHOL/HDL Ratio: 4.5 RATIO
Triglycerides: 272 mg/dL — ABNORMAL HIGH (ref <150)
VLDL: 54 mg/dL — ABNORMAL HIGH (ref 0–40)

## 2023-01-27 LAB — TSH: TSH: 2.186 u[IU]/mL (ref 0.350–4.500)

## 2023-01-27 MED ORDER — CARVEDILOL 3.125 MG PO TABS: 3.1250 mg | ORAL_TABLET | Freq: Two times a day (BID) | ORAL | Status: DC

## 2023-01-27 MED ORDER — CARVEDILOL 6.25 MG PO TABS
6.2500 mg | ORAL_TABLET | Freq: Two times a day (BID) | ORAL | Status: DC
  Administered 2023-01-27 – 2023-01-28 (×3): 6.25 mg via ORAL
  Filled 2023-01-27 (×3): qty 1

## 2023-01-27 MED ORDER — SODIUM CHLORIDE 0.9 % WEIGHT BASED INFUSION
3.0000 mL/kg/h | INTRAVENOUS | Status: DC
  Administered 2023-01-28: 3 mL/kg/h via INTRAVENOUS

## 2023-01-27 MED ORDER — PERFLUTREN LIPID MICROSPHERE
1.0000 mL | INTRAVENOUS | Status: AC | PRN
  Administered 2023-01-27: 3 mL via INTRAVENOUS

## 2023-01-27 MED ORDER — SODIUM CHLORIDE 0.9 % WEIGHT BASED INFUSION
1.0000 mL/kg/h | INTRAVENOUS | Status: DC
  Administered 2023-01-28: 1 mL/kg/h via INTRAVENOUS

## 2023-01-27 NOTE — Progress Notes (Signed)
ANTICOAGULATION CONSULT NOTE - follow-up  Pharmacy Consult for heparin  Indication: chest pain/ACS  Allergies  Allergen Reactions   Lidocaine Hives   Sulfa Antibiotics Hives    Patient Measurements: Height: '5\' 9"'$  (175.3 cm) Weight: 100 kg (220 lb 7.4 oz) IBW/kg (Calculated) : 70.7 Heparin Dosing Weight: 92.3kg   Vital Signs: Temp: 97.8 F (36.6 C) (03/10 1143) Temp Source: Oral (03/10 1143) BP: 111/73 (03/10 1143) Pulse Rate: 77 (03/10 1143)  Labs: Recent Labs    01/26/23 0855 01/26/23 1046 01/26/23 1955 01/27/23 0034 01/27/23 1110  HGB 15.4  --   --  14.2  --   HCT 46.1  --   --  44.0  --   PLT 391  --   --  377  --   HEPARINUNFRC  --   --  0.16* 0.52 0.41  CREATININE 1.25*  --   --  1.18  --   TROPONINIHS 5 120*  --   --   --      Estimated Creatinine Clearance: 66.9 mL/min (by C-G formula based on SCr of 1.18 mg/dL).   Medical History: Past Medical History:  Diagnosis Date   Bigeminy    DM (diabetes mellitus) ([place])    Frequent PVCs    Hyperlipidemia    Hypertension    Nocturia    Assessment: Patient presenting with CC of chest pain. Trop elevated to 120. No anticoagulation prior to admission. Pharmacy consulted to dose heparin.   Heparin level remains therapeutic at 0.41, on 1400 units/hr. Hgb 14.2, plt 377--stable. No line issues or signs/symptoms of bleeding reported.  Goal of Therapy:  Heparin level 0.3-0.7 units/ml Monitor platelets by anticoagulation protocol: Yes   Plan:  Continue heparin gtt '@1400'$  units/hr Monitor heparin level, CBC, s/sx of bleeding daily  LHC planned for Monday    Billey Gosling, PharmD PGY1 Pharmacy Resident 3/10/202412:26 PM

## 2023-01-27 NOTE — Progress Notes (Signed)
Echocardiogram 2D Echocardiogram has been performed.  Oneal Deputy Arshdeep Bolger RDCS 01/27/2023, 1:21 PM

## 2023-01-27 NOTE — Progress Notes (Addendum)
ANTICOAGULATION CONSULT NOTE - follow-up  Pharmacy Consult for heparin  Indication: chest pain/ACS  Allergies  Allergen Reactions   Lidocaine Hives   Sulfa Antibiotics Hives    Patient Measurements: Height: '5\' 9"'$  (175.3 cm) Weight: 100 kg (220 lb 7.4 oz) IBW/kg (Calculated) : 70.7 Heparin Dosing Weight: 92.3kg   Vital Signs: Temp: 98.1 F (36.7 C) (03/10 0415) Temp Source: Oral (03/10 0415) BP: 110/89 (03/10 0415) Pulse Rate: 49 (03/10 0415)  Labs: Recent Labs    01/26/23 0855 01/26/23 1046 01/26/23 1955 01/27/23 0034  HGB 15.4  --   --  14.2  HCT 46.1  --   --  44.0  PLT 391  --   --  377  HEPARINUNFRC  --   --  0.16* 0.52  CREATININE 1.25*  --   --  1.18  TROPONINIHS 5 120*  --   --      Estimated Creatinine Clearance: 66.9 mL/min (by C-G formula based on SCr of 1.18 mg/dL).   Medical History: Past Medical History:  Diagnosis Date   Bigeminy    DM (diabetes mellitus) ([place])    Frequent PVCs    Hyperlipidemia    Hypertension    Nocturia    Assessment: Patient presenting with CC of chest pain. Trop elevated to 120. No anticoagulation prior to admission. Pharmacy consulted to dose heparin.   Heparin level today is therapeutic at 0.52, on 1400 units/hr. Hgb 14.2, plt 377--stable. No line issues or signs/symptoms of bleeding reported.  Goal of Therapy:  Heparin level 0.3-0.7 units/ml Monitor platelets by anticoagulation protocol: Yes   Plan:  Continue heparin gtt '@1400'$  units/hr F/u confirmatory heparin level in 8hrs Monitor heparin level, CBC, s/sx of bleeding daily  LHC planned for Monday    Billey Gosling, PharmD PGY1 Pharmacy Resident 3/10/20249:26 AM

## 2023-01-27 NOTE — Progress Notes (Signed)
Cardiology Progress Note  Patient ID: Benjamin Erickson MRN: [id number] DOB: 22-Jun-1951 Date of Encounter: 01/27/2023  Primary Cardiologist: Minus Breeding, MD  Subjective   Chief Complaint: None.   HPI: No further chest pain.  Telemetry unremarkable.  Left heart catheterization tomorrow.  ROS:  All other ROS reviewed and negative. Pertinent positives noted in the HPI.     Inpatient Medications  Scheduled Meds:  aspirin EC  81 mg Oral Daily   carvedilol  6.25 mg Oral BID WC   dapagliflozin propanediol  10 mg Oral QAC breakfast   rosuvastatin  20 mg Oral QHS   sacubitril-valsartan  1 tablet Oral BID   spironolactone  12.5 mg Oral Daily   tamsulosin  0.4 mg Oral Daily   Continuous Infusions:  heparin 1,400 Units/hr (01/27/23 0417)   PRN Meds: acetaminophen, melatonin, nitroGLYCERIN, ondansetron (ZOFRAN) IV   Vital Signs   Vitals:   01/26/23 1930 01/27/23 0001 01/27/23 0415 01/27/23 1143  BP: (!) 115/57 102/67 110/89 111/73  Pulse: 77 (!) 58 (!) 49 77  Resp: (!) 21 (!) '21 18 20  '$ Temp: 97.9 F (36.6 C) 97.8 F (36.6 C) 98.1 F (36.7 C) 97.8 F (36.6 C)  TempSrc: Oral Oral Oral Oral  SpO2: 94% 93% 95% 96%  Weight:  100 kg    Height:        Intake/Output Summary (Last 24 hours) at 01/27/2023 1157 Last data filed at 01/27/2023 1000 Gross per 24 hour  Intake 331.55 ml  Output --  Net 331.55 ml      01/27/2023   12:01 AM 01/26/2023    4:05 PM 01/26/2023    8:44 AM  Last 3 Weights  Weight (lbs) 220 lb 7.4 oz 220 lb 10.9 oz 224 lb  Weight (kg) 100 kg 100.1 kg 101.606 kg      Telemetry  Overnight telemetry shows sinus rhythm 60 to 70 bpm with PVCs, which I personally reviewed.    Physical Exam   Vitals:   01/26/23 1930 01/27/23 0001 01/27/23 0415 01/27/23 1143  BP: (!) 115/57 102/67 110/89 111/73  Pulse: 77 (!) 58 (!) 49 77  Resp: (!) 21 (!) '21 18 20  '$ Temp: 97.9 F (36.6 C) 97.8 F (36.6 C) 98.1 F (36.7 C) 97.8 F (36.6 C)  TempSrc: Oral Oral Oral Oral   SpO2: 94% 93% 95% 96%  Weight:  100 kg    Height:        Intake/Output Summary (Last 24 hours) at 01/27/2023 1157 Last data filed at 01/27/2023 1000 Gross per 24 hour  Intake 331.55 ml  Output --  Net 331.55 ml       01/27/2023   12:01 AM 01/26/2023    4:05 PM 01/26/2023    8:44 AM  Last 3 Weights  Weight (lbs) 220 lb 7.4 oz 220 lb 10.9 oz 224 lb  Weight (kg) 100 kg 100.1 kg 101.606 kg    Body mass index is 32.56 kg/m.  General: Well nourished, well developed, in no acute distress Head: Atraumatic, normal size  Eyes: PEERLA, EOMI  Neck: Supple, no JVD Endocrine: No thryomegaly Cardiac: Normal S1, S2; RRR; no murmurs, rubs, or gallops Lungs: Clear to auscultation bilaterally, no wheezing, rhonchi or rales  Abd: Soft, nontender, no hepatomegaly  Ext: No edema, pulses 2+ Musculoskeletal: No deformities, BUE and BLE strength normal and equal Skin: Warm and dry, no rashes   Neuro: Alert and oriented to person, place, time, and situation, CNII-XII grossly intact, no focal deficits  Psych: Normal mood and affect   Labs  High Sensitivity Troponin:   Recent Labs  Lab 01/26/23 0855 01/26/23 1046  TROPONINIHS 5 120*     Cardiac EnzymesNo results for input(s): "TROPONINI" in the last 168 hours. No results for input(s): "TROPIPOC" in the last 168 hours.  Chemistry Recent Labs  Lab 01/26/23 0855 01/26/23 1046 01/27/23 0034  NA 138  --  137  K 4.8  --  3.9  CL 103  --  104  CO2 20*  --  24  GLUCOSE 135*  --  131*  BUN 23  --  23  CREATININE 1.25*  --  1.18  CALCIUM 9.4  --  9.1  PROT  --  7.0  --   ALBUMIN  --  3.8  --   AST  --  16  --   ALT  --  20  --   ALKPHOS  --  41  --   BILITOT  --  0.6  --   GFRNONAA >60  --  >60  ANIONGAP 15  --  9    Hematology Recent Labs  Lab 01/26/23 0855 01/27/23 0034  WBC 9.3 10.8*  RBC 4.89 4.64  HGB 15.4 14.2  HCT 46.1 44.0  MCV 94.3 94.8  MCH 31.5 30.6  MCHC 33.4 32.3  RDW 12.9 13.2  PLT 391 377   BNP Recent Labs  Lab  01/26/23 0855  BNP 25.2    DDimer No results for input(s): "DDIMER" in the last 168 hours.   Radiology  DG Chest 2 View  Result Date: 01/26/2023 CLINICAL DATA:  72 year old male with chest pain since 1730 hours. EXAM: CHEST - 2 VIEW COMPARISON:  Cardiac CT 03/24/2020 and earlier. FINDINGS: PA and lateral views at 0921 hours. 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 Other mediastinal contours are within normal limits. Stable lung volumes. Visualized tracheal air column is within normal limits. Both lungs appear clear. No pneumothorax or pleural effusion. No acute osseous abnormality identified. Negative visible bowel gas. IMPRESSION: No acute cardiopulmonary abnormality. 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 Electronically Signed   By: Genevie Ann M.D.   On: 01/26/2023 09:38    Cardiac Studies  LHC 10/16/2022   Mid LAD lesion is 60% stenosed.   1st Diag lesion is 95% stenosed.   RPDA lesion is 80% stenosed.   LV end diastolic pressure is normal.  Patient Profile  Benjamin Erickson is a 72 y.o. male with CAD, chronic systolic heart failure, hypertension, hyperlipidemia who was admitted on 01/26/2023 for non-STEMI.  Assessment & Plan   # Non-STEMI #CAD -Admitted with chest discomfort at rest.  Troponins have increased.  Known history of 60% mid LAD and 95% diagonal and 80% RPDA lesion that is been managed medically. -Given symptoms occurring at rest and rising troponin working diagnosis is an acute coronary syndrome.  Currently chest pain-free on heparin drip. -Continue aspirin. -On high intensity statin.  LDL may be artificially low.  Triglycerides elevated.  Continue high intensity statin.  Labs were checked not fully fasting. -N.p.o. for left heart catheterization tomorrow.  Need to make sure there is been no change in his coronary anatomy.  The mid LAD could be intervened upon if thought to be worsened. -Repeat echo  pending. -On beta-blocker.  Currently angina free. -Risk and benefits explained.  Willing to proceed with left heart catheterization tomorrow.  Shared Decision Making/Informed Consent The risks [stroke (1  in 1000), death (1 in 56), kidney failure [usually temporary] (1 in 500), bleeding (1 in 200), allergic reaction [possibly serious] (1 in 200)], benefits (diagnostic support and management of coronary artery disease) and alternatives of a cardiac catheterization were discussed in detail with Benjamin Erickson and he is willing to proceed.  # Chronic systolic heart failure, EF 30-35% -Euvolemic. -Continue Coreg 6.25 mg twice daily, Farxiga 10 mg daily, Entresto 49-51 mg twice daily.  Aldactone 12.5 mg daily added this admission. -Further management per outpatient cardiologist  # Hyperlipidemia -High intensity statin.  # Diabetes -Controlled with diet  # FEN -No intravenous fluids -DVT PPx: Heparin drip -Code: Full -Diet: Heart healthy   For questions or updates, please contact [place] Please consult [email or web address] for contact info under        Signed, [place] T. Audie Box, MD, [place]  01/27/2023 11:57 AM

## 2023-01-28 ENCOUNTER — Inpatient Hospital Stay (HOSPITAL_COMMUNITY)
Admission: EM | Disposition: A | Discharge status: Home / Self Care | Payer: Self-pay | Source: Home / Self Care | Attending: Cardiovascular Disease

## 2023-01-28 ENCOUNTER — Encounter (HOSPITAL_COMMUNITY): Payer: Self-pay | Admitting: Cardiovascular Disease

## 2023-01-28 ENCOUNTER — Other Ambulatory Visit: Payer: Self-pay | Admitting: Physician Assistant

## 2023-01-28 ENCOUNTER — Other Ambulatory Visit (HOSPITAL_COMMUNITY): Payer: PPO

## 2023-01-28 DIAGNOSIS — I5043 Acute on chronic combined systolic (congestive) and diastolic (congestive) heart failure: Secondary | ICD-10-CM | POA: Diagnosis not present

## 2023-01-28 DIAGNOSIS — Z79899 Other long term (current) drug therapy: Secondary | ICD-10-CM

## 2023-01-28 DIAGNOSIS — E785 Hyperlipidemia, unspecified: Secondary | ICD-10-CM

## 2023-01-28 DIAGNOSIS — I2510 Atherosclerotic heart disease of native coronary artery without angina pectoris: Secondary | ICD-10-CM | POA: Diagnosis not present

## 2023-01-28 DIAGNOSIS — I25110 Atherosclerotic heart disease of native coronary artery with unstable angina pectoris: Secondary | ICD-10-CM | POA: Diagnosis not present

## 2023-01-28 DIAGNOSIS — I214 Non-ST elevation (NSTEMI) myocardial infarction: Secondary | ICD-10-CM | POA: Diagnosis not present

## 2023-01-28 DIAGNOSIS — I10 Essential (primary) hypertension: Secondary | ICD-10-CM | POA: Diagnosis not present

## 2023-01-28 DIAGNOSIS — I21A1 Myocardial infarction type 2: Secondary | ICD-10-CM | POA: Diagnosis not present

## 2023-01-28 DIAGNOSIS — E118 Type 2 diabetes mellitus with unspecified complications: Secondary | ICD-10-CM

## 2023-01-28 HISTORY — PX: LEFT HEART CATH AND CORONARY ANGIOGRAPHY: CATH118249

## 2023-01-28 LAB — CBC
HCT: 45.7 % (ref 39.0–52.0)
Hemoglobin: 14.9 g/dL (ref 13.0–17.0)
MCH: 31 pg (ref 26.0–34.0)
MCHC: 32.6 g/dL (ref 30.0–36.0)
MCV: 95.2 fL (ref 80.0–100.0)
Platelets: 339 10*3/uL (ref 150–400)
RBC: 4.8 MIL/uL (ref 4.22–5.81)
RDW: 13 % (ref 11.5–15.5)
WBC: 11.4 10*3/uL — ABNORMAL HIGH (ref 4.0–10.5)
nRBC: 0 % (ref 0.0–0.2)

## 2023-01-28 LAB — GLUCOSE, CAPILLARY: Glucose-Capillary: 111 mg/dL — ABNORMAL HIGH (ref 70–99)

## 2023-01-28 LAB — HEPARIN LEVEL (UNFRACTIONATED): Heparin Unfractionated: 0.47 IU/mL (ref 0.30–0.70)

## 2023-01-28 SURGERY — LEFT HEART CATH AND CORONARY ANGIOGRAPHY
Anesthesia: LOCAL

## 2023-01-28 MED ORDER — MORPHINE SULFATE (PF) 2 MG/ML IV SOLN: 2.0000 mg | INTRAVENOUS | Status: DC | PRN

## 2023-01-28 MED ORDER — VERAPAMIL HCL 2.5 MG/ML IV SOLN
INTRA_ARTERIAL | Status: DC | PRN
  Administered 2023-01-28: 10 mL via INTRA_ARTERIAL

## 2023-01-28 MED ORDER — VERAPAMIL HCL 2.5 MG/ML IV SOLN
INTRAVENOUS | Status: AC
  Filled 2023-01-28: qty 2

## 2023-01-28 MED ORDER — SODIUM CHLORIDE 0.9% FLUSH: 3.0000 mL | INTRAVENOUS | Status: DC | PRN

## 2023-01-28 MED ORDER — MIDAZOLAM HCL 2 MG/2ML IJ SOLN
INTRAMUSCULAR | Status: AC
  Filled 2023-01-28: qty 2

## 2023-01-28 MED ORDER — HEPARIN SODIUM (PORCINE) 1000 UNIT/ML IJ SOLN
INTRAMUSCULAR | Status: AC
  Filled 2023-01-28: qty 10

## 2023-01-28 MED ORDER — ACETAMINOPHEN 325 MG PO TABS: 650.0000 mg | ORAL_TABLET | ORAL | Status: DC | PRN

## 2023-01-28 MED ORDER — ONDANSETRON HCL 4 MG/2ML IJ SOLN: 4.0000 mg | Freq: Four times a day (QID) | INTRAMUSCULAR | Status: DC | PRN

## 2023-01-28 MED ORDER — MIDAZOLAM HCL 2 MG/2ML IJ SOLN
INTRAMUSCULAR | Status: DC | PRN
  Administered 2023-01-28: 1 mg via INTRAVENOUS

## 2023-01-28 MED ORDER — FUROSEMIDE 10 MG/ML IJ SOLN
20.0000 mg | Freq: Once | INTRAMUSCULAR | Status: AC
  Administered 2023-01-28: 20 mg via INTRAVENOUS
  Filled 2023-01-28: qty 2

## 2023-01-28 MED ORDER — HEPARIN SODIUM (PORCINE) 1000 UNIT/ML IJ SOLN
INTRAMUSCULAR | Status: DC | PRN
  Administered 2023-01-28: 5000 [IU] via INTRAVENOUS

## 2023-01-28 MED ORDER — FENTANYL CITRATE (PF) 100 MCG/2ML IJ SOLN
INTRAMUSCULAR | Status: DC | PRN
  Administered 2023-01-28: 25 ug via INTRAVENOUS

## 2023-01-28 MED ORDER — SPIRONOLACTONE 25 MG PO TABS: 12.5000 mg | ORAL_TABLET | Freq: Every day | ORAL | 2 refills | Status: DC

## 2023-01-28 MED ORDER — SODIUM CHLORIDE 0.9 % IV SOLN: 250.0000 mL | INTRAVENOUS | Status: DC | PRN

## 2023-01-28 MED ORDER — FENTANYL CITRATE (PF) 100 MCG/2ML IJ SOLN
INTRAMUSCULAR | Status: AC
  Filled 2023-01-28: qty 2

## 2023-01-28 MED ORDER — LABETALOL HCL 5 MG/ML IV SOLN: 10.0000 mg | INTRAVENOUS | Status: DC | PRN

## 2023-01-28 MED ORDER — NITROGLYCERIN 1 MG/10 ML FOR IR/CATH LAB
INTRA_ARTERIAL | Status: AC
  Filled 2023-01-28: qty 10

## 2023-01-28 MED ORDER — ISOSORBIDE MONONITRATE ER 30 MG PO TB24: 30.0000 mg | ORAL_TABLET | Freq: Every day | ORAL | 3 refills | Status: DC

## 2023-01-28 MED ORDER — INSULIN ASPART 100 UNIT/ML IJ SOLN: 0.0000 [IU] | INTRAMUSCULAR | Status: DC

## 2023-01-28 MED ORDER — TETRACAINE HCL 1 % IJ SOLN
10.0000 mg | Freq: Once | INTRAMUSCULAR | Status: DC
  Filled 2023-01-28: qty 20

## 2023-01-28 MED ORDER — LIDOCAINE HCL (PF) 1 % IJ SOLN
INTRAMUSCULAR | Status: AC
  Filled 2023-01-28: qty 30

## 2023-01-28 MED ORDER — ASPIRIN 81 MG PO CHEW: 81.0000 mg | CHEWABLE_TABLET | Freq: Every day | ORAL | Status: DC

## 2023-01-28 MED ORDER — SODIUM CHLORIDE 0.9 % IV SOLN: INTRAVENOUS | Status: DC

## 2023-01-28 MED ORDER — HEPARIN (PORCINE) IN NACL 1000-0.9 UT/500ML-% IV SOLN
INTRAVENOUS | Status: DC | PRN
  Administered 2023-01-28 (×2): 500 mL

## 2023-01-28 MED ORDER — FUROSEMIDE 20 MG PO TABS: 20.0000 mg | ORAL_TABLET | Freq: Every day | ORAL | 3 refills | Status: DC

## 2023-01-28 MED ORDER — SODIUM CHLORIDE 0.9% FLUSH: 3.0000 mL | Freq: Two times a day (BID) | INTRAVENOUS | Status: DC

## 2023-01-28 MED ORDER — TETRACAINE HCL 1 % IJ SOLN
INTRAMUSCULAR | Status: DC | PRN
  Administered 2023-01-28: 10 mg

## 2023-01-28 MED ORDER — HYDRALAZINE HCL 20 MG/ML IJ SOLN: 10.0000 mg | INTRAMUSCULAR | Status: DC | PRN

## 2023-01-28 MED ORDER — ROSUVASTATIN CALCIUM 20 MG PO TABS: 20.0000 mg | ORAL_TABLET | Freq: Every day | ORAL | 1 refills | Status: DC

## 2023-01-28 MED ORDER — ATORVASTATIN CALCIUM 80 MG PO TABS: 80.0000 mg | ORAL_TABLET | Freq: Every day | ORAL | Status: DC

## 2023-01-28 MED ORDER — IOHEXOL 350 MG/ML SOLN
INTRAVENOUS | Status: DC | PRN
  Administered 2023-01-28: 45 mL

## 2023-01-28 SURGICAL SUPPLY — 12 items
CATH INFINITI JR4 5F (CATHETERS) IMPLANT
CATH OPTITORQUE TIG 4.0 5F (CATHETERS) IMPLANT
DEVICE RAD COMP TR BAND LRG (VASCULAR PRODUCTS) IMPLANT
GLIDESHEATH SLEND A-KIT 6F 22G (SHEATH) IMPLANT
GUIDEWIRE INQWIRE 1.5J.035X260 (WIRE) IMPLANT
INQWIRE 1.5J .035X260CM (WIRE) ×1
KIT HEART LEFT (KITS) ×1 IMPLANT
PACK CARDIAC CATHETERIZATION (CUSTOM PROCEDURE TRAY) ×1 IMPLANT
SHEATH PROBE COVER 6X72 (BAG) IMPLANT
TRANSDUCER W/STOPCOCK (MISCELLANEOUS) ×1 IMPLANT
TUBING CIL FLEX 10 FLL-RA (TUBING) ×1 IMPLANT
WIRE HI TORQ VERSACORE-J 145CM (WIRE) IMPLANT

## 2023-01-28 NOTE — H&P (View-Only) (Signed)
Rounding Note    Patient Name: Benjamin Erickson Date of Encounter: 01/28/2023  [place] Cardiologist: Minus Breeding, MD   Patient Profile     Benjamin Erickson is a 72 y.o. male with CAD, chronic HFrEF, hypertension, hyperlipidemia who was admitted on 01/26/2023 for non-STEMI.  Subjective   Felt great yesterday after brisk diuresis with Lasix and spironolactone.  However today with the IV fluids precath, he is noticing more dyspnea.  This is his classic symptoms that he feels prior to having any chest pain. Despite significant PVC burden on telemetry, not having anginal symptoms.   Assessment & Plan    Principal Problem:   Non-ST elevation (NSTEMI) myocardial infarction [facility]) Active Problems:   Coronary artery disease involving native coronary artery of native heart with unstable angina pectoris (HCC)   Chronic combined systolic and diastolic heart failure (HCC)   Essential hypertension   Hyperlipidemia with target LDL less than 70   Type 2 diabetes mellitus with complication, without long-term current use of insulin ([place])  Patient with known history of CAD including mLAD, D1 and PDA now presenting with symptoms concerning for unstable angina with elevated troponin => most consistent with non-STEMI ACS.  Scheduled for cardiac catheterization today (see informed consent/shared decision making from Dr. Kathalene Frames note on 01/27/2023. Working diagnosis of elevated troponin was ACS -> started on heparin, aspirin continued. Is also quite possible that chest discomfort and dyspnea was related to volume overload from CHF as his symptoms notably improved with diuresis. Is on high intensity statin-current LDL of 57.  May be inaccurate.  To better assess in the outpatient setting, for now continue high intensity statin. With history of reduced EF, was already on Coreg 6.25 mg twice daily, Entresto 49 - 51 mg twice daily, as well as Farxiga 10 mg daily => EF on echo here shows  notable improvement with no obvious RWMA.  spironolactone 12.5 mg daily added this hospitalization.  Patient noted brisk diuresis. Discussed sliding-scale Lasix for weight gain more than 3 pounds at home.  (He is weight is 4 pounds up today and he is dyspneic compared to yesterday) Depending on Findings would likely start long-acting nitrate. At home, and diabetes is diet controlled.  Is on Farxiga.  ----------------------------------------------------------------------------------------------------------------------  Inpatient Medications    Scheduled Meds:  aspirin EC  81 mg Oral Daily   carvedilol  6.25 mg Oral BID WC   dapagliflozin propanediol  10 mg Oral QAC breakfast   rosuvastatin  20 mg Oral QHS   sacubitril-valsartan  1 tablet Oral BID   spironolactone  12.5 mg Oral Daily   tamsulosin  0.4 mg Oral Daily   Continuous Infusions:  sodium chloride 1 mL/kg/hr (01/28/23 0023)   heparin 1,400 Units/hr (01/27/23 2131)   PRN Meds: acetaminophen, melatonin, nitroGLYCERIN, ondansetron (ZOFRAN) IV   Vital Signs    Vitals:   01/27/23 2021 01/28/23 0041 01/28/23 0408 01/28/23 0717  BP: 122/68 115/64 104/65 107/64  Pulse: 75 78 64 74  Resp: '19 14 15 20  '$ Temp: 97.9 F (36.6 C) 97.9 F (36.6 C) 97.8 F (36.6 C) 97.6 F (36.4 C)  TempSrc: Oral Oral Oral Oral  SpO2: 93% 94% 98% 96%  Weight:   101.4 kg   Height:        Intake/Output Summary (Last 24 hours) at 01/28/2023 0750 Last data filed at 01/28/2023 0023 Gross per 24 hour  Intake 353.47 ml  Output --  Net 353.47 ml  01/28/2023    4:08 AM 01/27/2023   12:01 AM 01/26/2023    4:05 PM  Last 3 Weights  Weight (lbs) 223 lb 9.6 oz 220 lb 7.4 oz 220 lb 10.9 oz  Weight (kg) 101.424 kg 100 kg 100.1 kg      Telemetry    Sinus rhythm in the 70s with PVCs and fusion beats.- Personally Reviewed  ECG    No new EKG today.- Personally Reviewed  Cardiac Studies   NM PET/CT cardiac perfusion test 09/18/2022: EF 22%.   ABNORMAL stress test.  Fixed small defect with mild reduction in uptake in the apical to mid inferior location-associated with RWMA. => C/W infarction. TTE 09/28/2022: EF 30 to 35% with mild HK of anteroseptal and inferoseptal wall.  Global HK.  GR 1 DD.  Unable to assess PAP.  Cannot exclude small vegetation on aortic valve with calcification. LHC 10/16/2022: Mid LAD lesion is 60%.1st Diag lesion is 95%. RPDA lesion is 80%;  LVEDP is normal. TTE 01/16/2023: EF 30 to 35%.  Moderately reduced function.  GR 1 DD.  Mild LVH.  Mildly reduced RV function with normal RV P/RAP.  AOV sclerosis no stenosis.   Limited 2D echo 01/27/2023: Low normal EF 50%.  No RWMA.  (Compared to 01/16/2023, EF improved)  Physical Exam   GEN: Obese but otherwise well-nourished and well-groomed.  No acute distress.   Neck: Supple, no JVD or bruit Cardiac: Normal S1-S2.  RRR, no M/R/G. Respiratory: Clear to auscultation bilaterally.  Nonlabored, good air movement.  No W/R/R. GI: Soft, nontender, non-distended  MS: No edema; No deformity. Neuro:  Nonfocal ; CN II through XII grossly intact; A&O x 3. Psych: Normal affect   Labs    High Sensitivity Troponin:    Recent Labs  Lab 01/26/23 0855 01/26/23 1046  TROPONINIHS 5 120*     Chemistry Recent Labs  Lab 01/26/23 0855 01/26/23 1046 01/27/23 0034  NA 138  --  137  K 4.8  --  3.9  CL 103  --  104  CO2 20*  --  24  GLUCOSE 135*  --  131*  BUN 23  --  23  CREATININE 1.25*  --  1.18  CALCIUM 9.4  --  9.1  PROT  --  7.0  --   ALBUMIN  --  3.8  --   AST  --  16  --   ALT  --  20  --   ALKPHOS  --  41  --   BILITOT  --  0.6  --   GFRNONAA >60  --  >60  ANIONGAP 15  --  9    Lipids  Recent Labs  Lab 01/27/23 0034  CHOL 143  TRIG 272*  HDL 32*  LDLCALC 57  CHOLHDL 4.5    Hematology Recent Labs  Lab 01/26/23 0855 01/27/23 0034 01/28/23 0027  WBC 9.3 10.8* 11.4*  RBC 4.89 4.64 4.80  HGB 15.4 14.2 14.9  HCT 46.1 44.0 45.7  MCV 94.3 94.8 95.2   MCH 31.5 30.6 31.0  MCHC 33.4 32.3 32.6  RDW 12.9 13.2 13.0  PLT 391 377 339   Thyroid  Recent Labs  Lab 01/27/23 0034  TSH 2.186    BNP Recent Labs  Lab 01/26/23 0855  BNP 25.2    DDimer No results for input(s): "DDIMER" in the last 168 hours.   Radiology    No new studies   For questions or updates, please contact [place]  Please consult [email or web address] for contact info under        Signed, Glenetta Hew, MD  01/28/2023, 7:50 AM

## 2023-01-28 NOTE — Progress Notes (Signed)
Rounding Note    Patient Name: Benjamin Erickson Date of Encounter: 01/28/2023  [place] Cardiologist: Minus Breeding, MD   Patient Profile     Benjamin Erickson is a 72 y.o. male with CAD, chronic HFrEF, hypertension, hyperlipidemia who was admitted on 01/26/2023 for non-STEMI.  Subjective   Felt great yesterday after brisk diuresis with Lasix and spironolactone.  However today with the IV fluids precath, he is noticing more dyspnea.  This is his classic symptoms that he feels prior to having any chest pain. Despite significant PVC burden on telemetry, not having anginal symptoms.   Assessment & Plan    Principal Problem:   Non-ST elevation (NSTEMI) myocardial infarction [facility]) Active Problems:   Coronary artery disease involving native coronary artery of native heart with unstable angina pectoris (HCC)   Chronic combined systolic and diastolic heart failure (HCC)   Essential hypertension   Hyperlipidemia with target LDL less than 70   Type 2 diabetes mellitus with complication, without long-term current use of insulin ([place])  Patient with known history of CAD including mLAD, D1 and PDA now presenting with symptoms concerning for unstable angina with elevated troponin => most consistent with non-STEMI ACS.  Scheduled for cardiac catheterization today (see informed consent/shared decision making from Dr. Kathalene Frames note on 01/27/2023. Working diagnosis of elevated troponin was ACS -> started on heparin, aspirin continued. Is also quite possible that chest discomfort and dyspnea was related to volume overload from CHF as his symptoms notably improved with diuresis. Is on high intensity statin-current LDL of 57.  May be inaccurate.  To better assess in the outpatient setting, for now continue high intensity statin. With history of reduced EF, was already on Coreg 6.25 mg twice daily, Entresto 49 - 51 mg twice daily, as well as Farxiga 10 mg daily => EF on echo here shows  notable improvement with no obvious RWMA.  spironolactone 12.5 mg daily added this hospitalization.  Patient noted brisk diuresis. Discussed sliding-scale Lasix for weight gain more than 3 pounds at home.  (He is weight is 4 pounds up today and he is dyspneic compared to yesterday) Depending on Findings would likely start long-acting nitrate. At home, and diabetes is diet controlled.  Is on Farxiga.  ----------------------------------------------------------------------------------------------------------------------  Inpatient Medications    Scheduled Meds:  aspirin EC  81 mg Oral Daily   carvedilol  6.25 mg Oral BID WC   dapagliflozin propanediol  10 mg Oral QAC breakfast   rosuvastatin  20 mg Oral QHS   sacubitril-valsartan  1 tablet Oral BID   spironolactone  12.5 mg Oral Daily   tamsulosin  0.4 mg Oral Daily   Continuous Infusions:  sodium chloride 1 mL/kg/hr (01/28/23 0023)   heparin 1,400 Units/hr (01/27/23 2131)   PRN Meds: acetaminophen, melatonin, nitroGLYCERIN, ondansetron (ZOFRAN) IV   Vital Signs    Vitals:   01/27/23 2021 01/28/23 0041 01/28/23 0408 01/28/23 0717  BP: 122/68 115/64 104/65 107/64  Pulse: 75 78 64 74  Resp: '19 14 15 20  '$ Temp: 97.9 F (36.6 C) 97.9 F (36.6 C) 97.8 F (36.6 C) 97.6 F (36.4 C)  TempSrc: Oral Oral Oral Oral  SpO2: 93% 94% 98% 96%  Weight:   101.4 kg   Height:        Intake/Output Summary (Last 24 hours) at 01/28/2023 0750 Last data filed at 01/28/2023 0023 Gross per 24 hour  Intake 353.47 ml  Output --  Net 353.47 ml  01/28/2023    4:08 AM 01/27/2023   12:01 AM 01/26/2023    4:05 PM  Last 3 Weights  Weight (lbs) 223 lb 9.6 oz 220 lb 7.4 oz 220 lb 10.9 oz  Weight (kg) 101.424 kg 100 kg 100.1 kg      Telemetry    Sinus rhythm in the 70s with PVCs and fusion beats.- Personally Reviewed  ECG    No new EKG today.- Personally Reviewed  Cardiac Studies   NM PET/CT cardiac perfusion test 09/18/2022: EF 22%.   ABNORMAL stress test.  Fixed small defect with mild reduction in uptake in the apical to mid inferior location-associated with RWMA. => C/W infarction. TTE 09/28/2022: EF 30 to 35% with mild HK of anteroseptal and inferoseptal wall.  Global HK.  GR 1 DD.  Unable to assess PAP.  Cannot exclude small vegetation on aortic valve with calcification. LHC 10/16/2022: Mid LAD lesion is 60%.1st Diag lesion is 95%. RPDA lesion is 80%;  LVEDP is normal. TTE 01/16/2023: EF 30 to 35%.  Moderately reduced function.  GR 1 DD.  Mild LVH.  Mildly reduced RV function with normal RV P/RAP.  AOV sclerosis no stenosis.   Limited 2D echo 01/27/2023: Low normal EF 50%.  No RWMA.  (Compared to 01/16/2023, EF improved)  Physical Exam   GEN: Obese but otherwise well-nourished and well-groomed.  No acute distress.   Neck: Supple, no JVD or bruit Cardiac: Normal S1-S2.  RRR, no M/R/G. Respiratory: Clear to auscultation bilaterally.  Nonlabored, good air movement.  No W/R/R. GI: Soft, nontender, non-distended  MS: No edema; No deformity. Neuro:  Nonfocal ; CN II through XII grossly intact; A&O x 3. Psych: Normal affect   Labs    High Sensitivity Troponin:    Recent Labs  Lab 01/26/23 0855 01/26/23 1046  TROPONINIHS 5 120*     Chemistry Recent Labs  Lab 01/26/23 0855 01/26/23 1046 01/27/23 0034  NA 138  --  137  K 4.8  --  3.9  CL 103  --  104  CO2 20*  --  24  GLUCOSE 135*  --  131*  BUN 23  --  23  CREATININE 1.25*  --  1.18  CALCIUM 9.4  --  9.1  PROT  --  7.0  --   ALBUMIN  --  3.8  --   AST  --  16  --   ALT  --  20  --   ALKPHOS  --  41  --   BILITOT  --  0.6  --   GFRNONAA >60  --  >60  ANIONGAP 15  --  9    Lipids  Recent Labs  Lab 01/27/23 0034  CHOL 143  TRIG 272*  HDL 32*  LDLCALC 57  CHOLHDL 4.5    Hematology Recent Labs  Lab 01/26/23 0855 01/27/23 0034 01/28/23 0027  WBC 9.3 10.8* 11.4*  RBC 4.89 4.64 4.80  HGB 15.4 14.2 14.9  HCT 46.1 44.0 45.7  MCV 94.3 94.8 95.2   MCH 31.5 30.6 31.0  MCHC 33.4 32.3 32.6  RDW 12.9 13.2 13.0  PLT 391 377 339   Thyroid  Recent Labs  Lab 01/27/23 0034  TSH 2.186    BNP Recent Labs  Lab 01/26/23 0855  BNP 25.2    DDimer No results for input(s): "DDIMER" in the last 168 hours.   Radiology    No new studies   For questions or updates, please contact [place]  Please consult [email or web address] for contact info under        Signed, Glenetta Hew, MD  01/28/2023, 7:50 AM

## 2023-01-28 NOTE — Discharge Instructions (Addendum)
Please hold metformin for 48 hours after cath due to interaction with contrast dye, you can resume Metformin on Thursday 3/14  No lifting over 5 lbs for 1 week. No sexual activity for 1 week. Keep procedure site clean & dry. If you notice increased pain, swelling, bleeding or pus, call/return!  You may shower, but no soaking baths/hot tubs/pools for 1 week.

## 2023-01-28 NOTE — Interval H&P Note (Signed)
Cath Lab Visit (complete for each Cath Lab visit)  Clinical Evaluation Leading to the Procedure:   ACS: Yes.    Non-ACS:    Anginal Classification: CCS II  Anti-ischemic medical therapy: Minimal Therapy (1 class of medications)  Non-Invasive Test Results: No non-invasive testing performed  Prior CABG: No previous CABG      History and Physical Interval Note:  01/28/2023 1:51 PM  Benjamin Erickson  has presented today for surgery, with the diagnosis of NSTEMI.  The various methods of treatment have been discussed with the patient and family. After consideration of risks, benefits and other options for treatment, the patient has consented to  Procedure(s): LEFT HEART CATH AND CORONARY ANGIOGRAPHY (N/A) as a surgical intervention.  The patient's history has been reviewed, patient examined, no change in status, stable for surgery.  I have reviewed the patient's chart and labs.  Questions were answered to the patient's satisfaction.     Quay Burow

## 2023-01-28 NOTE — Discharge Summary (Addendum)
Discharge Summary    Patient ID: Benjamin Erickson MRN: [id number]; DOB: Apr 24, 1951  Admit date: 01/26/2023 Discharge date: 01/28/2023  PCP:  [place] Pepper, [place] [facility] Cardiologist:  Minus Breeding, MD        Discharge Diagnoses    Principal Problem:   Acute on chronic combined systolic and diastolic CHF (congestive heart failure) ([place]) Active Problems:   Coronary artery disease involving native coronary artery of native heart with unstable angina pectoris ([place])   Chronic combined systolic and diastolic heart failure ([place])   Myocardial infarction due to demand ischemia [facility])   Essential hypertension   Hyperlipidemia with target LDL less than 70   Type 2 diabetes mellitus with complication, without long-term current use of insulin ([place])   Diagnostic Studies/Procedures    Limited echo 01/27/2023  1. Left ventricular ejection fraction, by estimation, is 50%. The left  ventricle has low normal function. The left ventricle has no regional wall  motion abnormalities.   2. Right ventricular systolic function is normal. The right ventricular  size is normal.   3. Compared side by side to 01/15/23 echo. LVEF has improved since prior  study.    Cath 01/28/2023   Mid LAD lesion is 60% stenosed.   1st Diag lesion is 95% stenosed.   RPDA lesion is 80% stenosed.   Mid Cx lesion is 80% stenosed.   IMPRESSION: Mr. Stika has no change in his coronary anatomy compared to his cardiac catheterization performed by Dr. Martinique last year.  He has nonobstructive CAD.  Medical therapy will be recommended.  The sheath was removed and a TR band was placed on the right wrist to achieve patent hemostasis.  The patient left lab in stable condition.   _____________   History of Present Illness     Benjamin Erickson is a 72 y.o. male with CAD, chronic systolic heart failure, hypertension, hyperlipidemia who presents with non-STEMI. Left heart catheterization November 2023  with 60% mid LAD, 95% D1 and 80% RPDA lesion. These were managed medically.   Benjamin Erickson reports symptoms of chest discomfort over the past 48 hours.  He describes tightness in his chest as well as pressure.  Symptoms are occurring at rest.  He reports he is currently chest pain-free.  They resolved once he got to the emergency room.  He reports no fevers or chills.  Denies any shortness of breath.  Known history of a nonischemic cardiomyopathy.  He does have recent left heart catheterization with obstructive disease.   In the emergency room blood pressure is stable.  Labs notable for serum creatinine of 1.25.  Hemoglobin 15.4.  Initial troponin 5.  Trending up to 120.  Chest x-ray negative.  Echo on 01/16/2023 with LVEF 30-35% which is chronic.  No signs of volume overload today on examination.  His exam is normal.  EKG demonstrates sinus rhythm heart rate 59 with nonspecific IVCD, single PVC noted.    Related to [facility] Course     Consultants: N/A   Patient was admitted to cardiology service.  Serial troponin went up from 5 to 120.  Repeat limited echocardiogram obtained on 01/27/2023 showed EF 50%, no regional wall motion abnormality.  Patient ultimately underwent a cardiac catheterization on 01/28/2023 which showed no significant change in coronary anatomy when compared to the previous cardiac catheterization in November 2023.  Medical therapy was recommended.  Patient was short of breath, he was given a single dose of IV Lasix  prior to discharge.  He is stable for discharge from the cardiac perspective. Per Dr. Ellyn Hack, consider outpatient heart monitor given frequent PVCs during this hospitalization.  With stable coronary disease, diagnosis would not be non-STEMI but would be elevated troponin/myocardial infarction due to demand ischemia.  A patient noted feeling significantly better after diuresis on the morning after admission.  He said he felt the best that he felt in a while as far  as the dyspnea and chest discomfort standpoint.  He then began to have more dyspnea upon receiving IV fluids precath.  He did have some orthopnea in the Cath Lab although his EDP was only 7.  My suspicion is that his chest pain is microvascular ischemia related to volume overload.  He said he was 4 pounds up from his dry weight upon arrival.  We discussed sliding scale Lasix and I did give 1 dose of IV Lasix 20 mg today to make up to the IV fluids precath. Plan going forward will be for him to continue 20 mg Lasix daily along with spironolactone and other medications.  We discussed sliding scale Lasix based on weight gain more than 3 pounds in a day or 5 pounds in a week taking additional doses.  After a pretty long conversation with the patient and his wife, they seem to understand and accept the explanation of her symptoms.  We were very reassured and reassured that his CAD was stable.  Thankfully, the echocardiogram during this admission was actually improved which may be reflective of the diuresis that occurred prior to having the echo.  This was suggested with bundle-branch related cardiomyopathy, he has some volume dependent symptoms combined systolic and diastolic heart failure.      Did the patient have an acute coronary syndrome (MI, NSTEMI, STEMI, etc) this admission?:  No                               Did the patient have a percutaneous coronary intervention (stent / angioplasty)?:  No.      _____________  Discharge Vitals Blood pressure 108/73, pulse 75, temperature (!) 96.7 F (35.9 C), temperature source Axillary, resp. rate 19, height '5\' 9"'$  (1.753 m), weight 101.4 kg, SpO2 96 %.  Filed Weights   01/26/23 1605 01/27/23 0001 01/28/23 0408  Weight: 100.1 kg 100 kg 101.4 kg  GEN: Obese but otherwise well-nourished and well-groomed.  No acute distress.   Neck: Supple, no JVD or bruit Cardiac: Normal S1-S2.  RRR, no M/R/G. Respiratory: Clear to auscultation bilaterally.  Nonlabored,  good air movement.  No W/R/R. GI: Soft, nontender, non-distended  MS: No edema; No deformity. Neuro:  Nonfocal ; CN II through XII grossly intact; A&O x 3. Psych: Normal affect   Labs & Radiologic Studies    CBC Recent Labs    01/27/23 0034 01/28/23 0027  WBC 10.8* 11.4*  HGB 14.2 14.9  HCT 44.0 45.7  MCV 94.8 95.2  PLT 377 [id number]   Basic Metabolic Panel Recent Labs    01/26/23 0855 01/27/23 0034  NA 138 137  K 4.8 3.9  CL 103 104  CO2 20* 24  GLUCOSE 135* 131*  BUN 23 23  CREATININE 1.25* 1.18  CALCIUM 9.4 9.1   Liver Function Tests Recent Labs    01/26/23 1046  AST 16  ALT 20  ALKPHOS 41  BILITOT 0.6  PROT 7.0  ALBUMIN 3.8   No results for input(s): "LIPASE", "  AMYLASE" in the last 72 hours. High Sensitivity Troponin:   Recent Labs  Lab 01/26/23 0855 01/26/23 1046  TROPONINIHS 5 120*    BNP Invalid input(s): "POCBNP" D-Dimer No results for input(s): "DDIMER" in the last 72 hours. Hemoglobin A1C No results for input(s): "HGBA1C" in the last 72 hours. Fasting Lipid Panel Recent Labs    01/27/23 0034  CHOL 143  HDL 32*  LDLCALC 57  TRIG 272*  CHOLHDL 4.5   Thyroid Function Tests Recent Labs    01/27/23 0034  TSH 2.186   _____________  CARDIAC CATHETERIZATION  Result Date: 01/28/2023 Images from the original result were not included.   Mid LAD lesion is 60% stenosed.   1st Diag lesion is 95% stenosed.   RPDA lesion is 80% stenosed.   Mid Cx lesion is 80% stenosed. Benjamin Erickson is a 72 y.o. male  [id number] LOCATION:  FACILITY: [facility] PHYSICIAN: Quay Burow, M.D. December 25, 1950 DATE OF PROCEDURE:  01/28/2023 DATE OF DISCHARGE: CARDIAC CATHETERIZATION History obtained from chart review.Benjamin Erickson is a 72 y.o. male with CAD (cardiac catheterization performed 1 year ago by Dr. Martinique revealed noncritical CAD, medical therapy was recommended)., chronic HFrEF, hypertension, hyperlipidemia who was admitted on 01/26/2023 for non-STEMI.   Mr.  Erickson has no change in his coronary anatomy compared to his cardiac catheterization performed by Dr. Martinique last year.  He has nonobstructive CAD.  Medical therapy will be recommended.  The sheath was removed and a TR band was placed on the right wrist to achieve patent hemostasis.  The patient left lab in stable condition. Quay Burow. MD, [facility] 01/28/2023 2:45 PM    ECHOCARDIOGRAM LIMITED  Result Date: 01/27/2023    ECHOCARDIOGRAM LIMITED REPORT   Patient Name:   Benjamin Erickson Date of Exam: 01/27/2023 Medical Rec #:  [id number]         Height:       69.0 in Accession #:    [id number]        Weight:       220.5 lb Date of Birth:  04-22-1951         BSA:          2.153 m Patient Age:    23 years          BP:           111/73 mmHg Patient Gender: M                 HR:           68 bpm. Exam Location:  Inpatient Procedure: Limited Echo and Intracardiac Opacification Agent Indications:    NSTEMI  History:        Patient has prior history of Echocardiogram examinations, most                 recent 01/16/2023. CHF; Risk Factors:Hypertension, Diabetes and                 Dyslipidemia.  Sonographer:    [place] Referring Phys: [id number] [place]  Sonographer Comments: Limited to reassess EF and Wall Motion IMPRESSIONS  1. Left ventricular ejection fraction, by estimation, is 50%. The left ventricle has low normal function. The left ventricle has no regional wall motion abnormalities.  2. Right ventricular systolic function is normal. The right ventricular size is normal.  3. Compared side by side to 01/15/23 echo. LVEF has improved since prior study. FINDINGS  Left Ventricle: Left ventricular ejection fraction, by  estimation, is 50%. The left ventricle has low normal function. The left ventricle has no regional wall motion abnormalities. Definity contrast agent was given IV to delineate the left ventricular endocardial borders. Right Ventricle: The right ventricular size is normal. Right  vetricular wall thickness was not well visualized. Right ventricular systolic function is normal. Carlyle Dolly MD Electronically signed by Carlyle Dolly MD Signature Date/Time: 01/27/2023/1:38:24 PM    Final    DG Chest 2 View  Result Date: 01/26/2023 CLINICAL DATA:  72 year old male with chest pain since 1730 hours. EXAM: CHEST - 2 VIEW COMPARISON:  Cardiac CT 03/24/2020 and earlier. FINDINGS: PA and lateral views at 0921 hours. 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 Other mediastinal contours are within normal limits. Stable lung volumes. Visualized tracheal air column is within normal limits. Both lungs appear clear. No pneumothorax or pleural effusion. No acute osseous abnormality identified. Negative visible bowel gas. IMPRESSION: No acute cardiopulmonary abnormality. 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 Electronically Signed   By: Genevie Ann M.D.   On: 01/26/2023 09:38   ECHOCARDIOGRAM COMPLETE  Result Date: 01/16/2023    ECHOCARDIOGRAM REPORT   Patient Name:   Benjamin Erickson Date of Exam: 01/15/2023 Medical Rec #:  [id number]         Height:       69.0 in Accession #:    [id number]        Weight:       226.4 lb Date of Birth:  [birth year]         BSA:          2.178 m Patient Age:    59 years          BP:           120/70 mmHg Patient Gender: M                 HR:           61 bpm. Exam Location:  Outpatient Procedure: 2D Echo, Cardiac Doppler, Color Doppler and Intracardiac            Opacification Agent Indications:    CHF  History:        Patient has prior history of Echocardiogram examinations, most                 recent 09/28/2022. CAD; Risk Factors:Hypertension, Former                 Smoker, Dyslipidemia and Diabetes.  Sonographer:    Leavy Cella RDCS Referring Phys: [id number] [place]  Sonographer Comments: Post definity 110-70 IMPRESSIONS  1. Technically difficult study, poor visualization even with  contrast administration. Left ventricular ejection fraction, by estimation, is 30 to 35%. The left ventricle has moderately decreased function. Left ventricular endocardial border not optimally defined to evaluate regional wall motion. There is mild left ventricular hypertrophy. Left ventricular diastolic parameters are consistent with Grade I diastolic dysfunction (impaired relaxation).  2. Right ventricular systolic function is mildly reduced. The right ventricular size is normal. There is normal pulmonary artery systolic pressure. The estimated right ventricular systolic pressure is [id number] mmHg.  3. The mitral valve is normal in structure. No evidence of mitral valve regurgitation. No evidence of mitral stenosis.  4. The aortic valve is tricuspid. Aortic valve regurgitation is not visualized. Aortic valve sclerosis/calcification is present, without any evidence of aortic stenosis.  5. Aortic dilatation noted. There is dilatation  of the ascending aorta, measuring 40 mm.  6. The inferior vena cava is normal in size with greater than 50% respiratory variability, suggesting right atrial pressure of 3 mmHg. Conclusion(s)/Recommendation(s): Given poor quality images, would recommend cardiac MRI to better evaluate LV systolic function and etiology of cardiomyopathy. FINDINGS  Left Ventricle: Left ventricular ejection fraction, by estimation, is 30 to 35%. The left ventricle has moderately decreased function. Left ventricular endocardial border not optimally defined to evaluate regional wall motion. Definity contrast agent was given IV to delineate the left ventricular endocardial borders. The left ventricular internal cavity size was normal in size. There is mild left ventricular hypertrophy. Left ventricular diastolic parameters are consistent with Grade I diastolic dysfunction (impaired relaxation). Right Ventricle: The right ventricular size is normal. Right vetricular wall thickness was not well visualized. Right  ventricular systolic function is mildly reduced. There is normal pulmonary artery systolic pressure. The tricuspid regurgitant velocity is 2.47 m/s, and with an assumed right atrial pressure of 3 mmHg, the estimated right ventricular systolic pressure is [id number] mmHg. Left Atrium: Left atrial size was normal in size. Right Atrium: Right atrial size was normal in size. Pericardium: There is no evidence of pericardial effusion. Mitral Valve: The mitral valve is normal in structure. No evidence of mitral valve regurgitation. No evidence of mitral valve stenosis. Tricuspid Valve: The tricuspid valve is normal in structure. Tricuspid valve regurgitation is trivial. Aortic Valve: The aortic valve is tricuspid. Aortic valve regurgitation is not visualized. Aortic valve sclerosis/calcification is present, without any evidence of aortic stenosis. Pulmonic Valve: The pulmonic valve was not well visualized. Pulmonic valve regurgitation is not visualized. Aorta: The aortic root is normal in size and structure and aortic dilatation noted. There is dilatation of the ascending aorta, measuring 40 mm. Venous: The inferior vena cava is normal in size with greater than 50% respiratory variability, suggesting right atrial pressure of 3 mmHg. IAS/Shunts: The interatrial septum was not well visualized.  LEFT VENTRICLE PLAX 2D LVIDd:         5.46 cm   Diastology LVIDs:         3.87 cm   LV e' medial:    5.44 cm/s LV PW:         1.18 cm   LV E/e' medial:  10.6 LV IVS:        0.99 cm   LV e' lateral:   9.79 cm/s LVOT diam:     2.00 cm   LV E/e' lateral: 5.9 LV SV:         52 LV SV Index:   24 LVOT Area:     3.14 cm  RIGHT VENTRICLE RV Basal diam:  3.54 cm RV Mid diam:    3.99 cm RV S prime:     9.25 cm/s TAPSE (M-mode): 2.1 cm LEFT ATRIUM             Index        RIGHT ATRIUM          Index LA diam:        4.30 cm 1.97 cm/m   RA Area:     9.82 cm LA Vol (A2C):   21.6 ml 9.92 ml/m   RA Volume:   21.40 ml 9.83 ml/m LA Vol (A4C):   48.7 ml  22.36 ml/m LA Biplane Vol: 34.9 ml 16.03 ml/m  AORTIC VALVE LVOT Vmax:   69.70 cm/s LVOT Vmean:  44.500 cm/s LVOT VTI:    0.166 m  AORTA Ao  Root diam: 3.90 cm Ao Asc diam:  4.00 cm MITRAL VALVE               TRICUSPID VALVE MV Area (PHT): 3.53 cm    TR Peak grad:   24.4 mmHg MV Decel Time: 215 msec    TR Vmax:        247.00 cm/s MV E velocity: 57.60 cm/s MV A velocity: 84.90 cm/s  SHUNTS MV E/A ratio:  0.68        Systemic VTI:  0.17 m                            Systemic Diam: 2.00 cm Oswaldo Milian MD Electronically signed by Oswaldo Milian MD Signature Date/Time: 01/16/2023/12:48:12 PM    Final    Disposition   Pt is being discharged home today in good condition.  Follow-up Plans & Appointments     Follow-up Information     Benjamin Reel, NP Follow up on 02/08/2023.   Specialty: Cardiology Why: '@9'$ :30AM. Post hospital cardiology follow up Contact information: [address] [place] [address] [place] [facility] Follow up on 02/04/2023.   Specialty: Cardiology Why: obtain non-fasting BMET blood work to make sure renal function is stable. Contact information: [place] [id number] [place] [id number]               Discharge Instructions     AMB referral to Phase II Cardiac Rehabilitation   Complete by: As directed    Diagnosis: NSTEMI   After initial evaluation and assessments completed: Virtual Based Care may be provided alone or in conjunction with Phase 2 Cardiac Rehab based on patient barriers.: Yes   Intensive Cardiac Rehabilitation (ICR) [place] location only OR Traditional Cardiac Rehabilitation (TCR) *If criteria for ICR are not met will enroll in TCR [facility] only): Yes   Diet - low sodium heart healthy   Complete by: As directed    Increase activity slowly   Complete by: As directed         Discharge Medications   Allergies as of  01/28/2023       Reactions   Lidocaine Hives   Sulfa Antibiotics Hives        Medication List     TAKE these medications    acetaminophen 325 MG tablet Commonly known as: TYLENOL Take 650 mg by mouth as needed for moderate pain.   aspirin EC 81 MG tablet Take 81 mg by mouth daily. Swallow whole.   carvedilol 6.25 MG tablet Commonly known as: COREG TAKE 1 TABLET BY MOUTH TWICE A DAY What changed: when to take this   dapagliflozin propanediol 10 MG Tabs tablet Commonly known as: Farxiga Take 1 tablet (10 mg total) by mouth daily before breakfast.   furosemide 20 MG tablet Commonly known as: LASIX Take 1 tablet (20 mg total) by mouth daily. May take extra dose of lasix if has worsening dyspnea or if your weight increase by more than 3 lbs overnight or 5 lbs in a single week What changed: additional instructions Notes to patient: SEE ADDITIONAL INSTRUCTIONS ABOVE    isosorbide mononitrate 30 MG 24 hr tablet Commonly known as: IMDUR Take 1 tablet (30 mg total) by mouth daily.   metFORMIN 500 MG tablet Commonly known as: GLUCOPHAGE Take 1 tablet (500 mg total) by mouth 2 (two) times  daily.   Praluent 150 MG/ML [place] Generic drug: Alirocumab INJECT 150 MG INTO THE SKIN EVERY 14 (FOURTEEN) DAYS.   rosuvastatin 20 MG tablet Commonly known as: CRESTOR Take 1 tablet (20 mg total) by mouth at bedtime. What changed:  medication strength how much to take how to take this when to take this additional instructions   sacubitril-valsartan 49-51 MG Commonly known as: ENTRESTO Take 1 tablet by mouth 2 (two) times daily.   spironolactone 25 MG tablet Commonly known as: ALDACTONE Take 0.5 tablets (12.5 mg total) by mouth daily. Start taking on: January 29, 2023   tamsulosin 0.4 MG Caps capsule Commonly known as: FLOMAX Take 0.4 mg by mouth daily.   Tums Ultra 1000 400 MG chewable tablet Generic drug: calcium elemental as carbonate Chew 2,000-3,000 mg by mouth as needed  for heartburn.           Outstanding Labs/Studies   BMET in 1 week Consider outpatient heart monitor  Duration of Discharge Encounter   Greater than 30 minutes including physician time.  Signed, Glenetta Hew, MD 01/28/2023, 11:10 PM

## 2023-01-28 NOTE — Progress Notes (Signed)
ANTICOAGULATION CONSULT NOTE - follow-up  Pharmacy Consult for heparin  Indication: chest pain/ACS  Allergies  Allergen Reactions   Lidocaine Hives   Sulfa Antibiotics Hives    Patient Measurements: Height: '5\' 9"'$  (175.3 cm) Weight: 101.4 kg (223 lb 9.6 oz) IBW/kg (Calculated) : 70.7 Heparin Dosing Weight: 92.3kg   Vital Signs: Temp: 97.6 F (36.4 C) (03/11 0717) Temp Source: Oral (03/11 0717) BP: 107/64 (03/11 0717) Pulse Rate: 74 (03/11 0717)  Labs: Recent Labs    01/26/23 0855 01/26/23 1046 01/26/23 1955 01/27/23 0034 01/27/23 1110 01/28/23 0027  HGB 15.4  --   --  14.2  --  14.9  HCT 46.1  --   --  44.0  --  45.7  PLT 391  --   --  377  --  339  HEPARINUNFRC  --   --    < > 0.52 0.41 0.47  CREATININE 1.25*  --   --  1.18  --   --   TROPONINIHS 5 120*  --   --   --   --    < > = values in this interval not displayed.     Estimated Creatinine Clearance: 67.4 mL/min (by C-G formula based on SCr of 1.18 mg/dL).   Medical History: Past Medical History:  Diagnosis Date   Bigeminy    DM (diabetes mellitus) ([place])    Frequent PVCs    Hyperlipidemia    Hypertension    Nocturia    Assessment: Patient presenting with CC of chest pain. Trop elevated to 120. No anticoagulation prior to admission. Pharmacy consulted to dose heparin.   Heparin level remains therapeutic at 0.47, on 1400 units/hr. CBC stable  Goal of Therapy:  Heparin level 0.3-0.7 units/ml Monitor platelets by anticoagulation protocol: Yes   Plan:  Continue heparin gtt '@1400'$  units/hr Will follow plans for cath  Hildred Laser, PharmD Clinical Pharmacist **Pharmacist phone directory can now be found on [email or web address] ([email or web address]).  Listed under [email or web address].

## 2023-01-29 LAB — HEMOGLOBIN A1C
Hgb A1c MFr Bld: 6.7 % — ABNORMAL HIGH (ref 4.8–5.6)
Mean Plasma Glucose: 146 mg/dL

## 2023-01-29 MED FILL — Verapamil HCl IV Soln 2.5 MG/ML: INTRAVENOUS | Qty: 2 | Status: AC

## 2023-01-29 MED FILL — Lidocaine HCl Local Preservative Free (PF) Inj 1%: INTRAMUSCULAR | Qty: 30 | Status: AC

## 2023-01-31 NOTE — Progress Notes (Signed)
Cardiology Clinic Note   Date: 02/08/2023 ID: Lean, Valliere 01/07/1951, MRN [id number]  Primary Cardiologist:  Minus Breeding, MD  Patient Profile    Benjamin Erickson is a 72 y.o. male who presents to the clinic today for hospital follow-up.  Past medical history significant for: Nonobstructive CAD.  Coronary CT 03/24/2020: Calcium score 1805 (94 percentile). Aortic atherosclerosis. FFR could not be performed secondary to artifact.  Exercise stress test 06/23/2020: Intermediate risk secondary to decreased exercise effort. No ischemic changes noted.  PET CT 09/18/2022: High risk study. Small fixed interior perfusion defect. Systolic function severely reduced. Recommend LHC.  LHC 01/28/2023: Mid LAD 60%.  D1 95%.  RPDA 80%.  Mid LCx 80%.  No change from previous angiography November 2023.  Continue medical management. Chronic combined systolic and diastolic heart failure.  Echo 09/28/2022: EF 30-35%. Mild hypokinesis anteroseptal/inferoseptal wall. Global hypokinesis. Grade I DD. Aortic valve calcification. Cannot rule out small vegetation on the [place]. 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 Limited echo 01/27/2023: EF 50%.  RV function normal. Hypertension.  Hyperlipidemia.  Lipid panel 01/27/2023: LDL 57, HDL 32, TG 272, total 143. Insulin dependent diabetes.    History of Present Illness    Benjamin Erickson was initially seen by Dr. Percival Spanish on 03/01/2020 for chest pain. Coronary CT as above. Exercise stress test August 2021 showed no ischemic changes. When patient was seen in August 2023 he complained of progressive shortness of breath and some chest discomfort. PET CT October 2023 was a high risk study and LHC was recommended. Systolic function was also found to be severely reduced. Echo showed EF 30-35%, hypokinesis, grade I DD. LHC November 2023 showed obstructive disease in small diagnoal branch and mid to distal PDA. Medical therapy was recommended.  Patient was last seen in the  office by me on 12/26/2022.  At that time he complained of shortness of breath that is worse in the morning and improving by mid afternoon.  He was started on Lasix at his office visit in January 2024 despite denying lower extremity edema, abdominal bloating/tightness or early satiety.  His weight came down approximately 5 pounds and was stable with daily weight.  Benjamin Erickson was added to his regimen.  Most recently, patient presented to the ED on 01/26/2023 with sternal chest pain described as pressure.  Troponin was elevated.  Patient reported 4 pound weight gain upon arrival to ED.  He was given 1 dose of IV Lasix.  He underwent LHC which was unchanged from previous catheterization showing nonobstructive CAD.  Limited echo showed improved EF to 50%.  He was discharged with the addition of spironolactone.  Today, patient reports he feels the best he has in a long time. He denies chest pain, pressure or tightness. His breathing is greatly improved since getting out of the hospital. He has never had lower extremity edema. He denies abdominal fullness/bloating or early satiety.  Daily weight has been stable.  He reports breaking out in hives after his heart catheterization.  This happened after his last catheterization and was thought to be a reaction to lidocaine.  He he now believes he is allergic to contrast dye.  That has been added to his list of allergies.  Patient also reports he has been unable to take isosorbide.  He tried taking it at night but suffered from horrible dreams.  Daytime dose made him feel "just awful" so he has forgone taking it.  He has a known history of being intolerant to  statins.  He was instructed to take Crestor 20 mg daily when he was discharged from the hospital.  He has been taking it every other day along with Praluent and seems to be tolerating it.  He is tolerating all of his other medications without difficulty.   ROS: All other systems reviewed and are otherwise negative except  as noted in History of Present Illness.  Studies Reviewed    ECG is not ordered today.      Physical Exam    VS:  BP 114/68   Pulse 68   Ht 5\' 9"  (1.753 m)   Wt 223 lb 12.8 oz (101.5 kg)   SpO2 95%   BMI 33.05 kg/m  , BMI Body mass index is 33.05 kg/m.  GEN: Well nourished, well developed, in no acute distress. Neck: No JVD or carotid bruits. Cardiac:  RRR. No murmurs. No rubs or gallops.   Respiratory:  Respirations regular and unlabored. Clear to auscultation without rales, wheezing or rhonchi. GI: Soft, nontender, nondistended. Extremities: Radials/DP/PT 2+ and equal bilaterally. No clubbing or cyanosis. No edema.  Skin: Warm and dry, no rash. Neuro: Strength intact.  Assessment & Plan   CAD.  Recent hospitalization for chest pain.  LHC March 2024 showed nonobstructive CAD and medical therapy was continued.  Patient denies chest pain, tightness, pressure since getting out of the hospital. Continue aspirin, carvedilol, Praluent, and Crestor.  Chronic systolic heart failure.  NYHA II-III.  Limited echo March 2024 showed EF 50%.  Patient has never had lower extremity edema.  He denies abdominal bloating/fullness or early satiety.  Daily weight has been stable at home.  He reports brisk diuresis with Lasix and spironolactone. Euvolemic and well compensated on exam.  Continue Lasix, sliding scale Lasix, spironolactone, isosorbide, Farxiga, carvedilol and Entresto.  Potassium was slightly elevated at 5.3 at last blood draw.  He was instructed to limit potassium rich foods such as leafy greens, potatoes, sweet potatoes, citrus fruit, and dried fruit.  Will get repeat BMP today. Hypertension.  BP today 122/70.  Patient denies headaches or dizziness.  Continue Entresto, spironolactone and carvedilol. Hyperlipidemia.  LDL March 2024 57, at goal.  Patient is statin intolerant.  He is tolerating Crestor every other day.  Continue Praluent and Crestor.  Disposition: Will prescribe SL NTG.   BMP today.  Return in 3 months or sooner as needed.    Cardiac Rehabilitation Eligibility Assessment  The patient is ready to start cardiac rehabilitation from a cardiac standpoint.        Signed, Justice Britain. Yisrael Obryan, DNP, NP-C

## 2023-02-04 ENCOUNTER — Other Ambulatory Visit: Payer: Self-pay

## 2023-02-04 DIAGNOSIS — Z79899 Other long term (current) drug therapy: Secondary | ICD-10-CM

## 2023-02-05 LAB — BASIC METABOLIC PANEL
BUN/Creatinine Ratio: 24 (ref 10–24)
BUN: 30 mg/dL — ABNORMAL HIGH (ref 8–27)
CO2: 25 mmol/L (ref 20–29)
Calcium: 10.1 mg/dL (ref 8.6–10.2)
Chloride: 98 mmol/L (ref 96–106)
Creatinine, Ser: 1.25 mg/dL (ref 0.76–1.27)
Glucose: 90 mg/dL (ref 70–99)
Potassium: 5.3 mmol/L — ABNORMAL HIGH (ref 3.5–5.2)
Sodium: 138 mmol/L (ref 134–144)
eGFR: 62 mL/min/{1.73_m2} (ref >59)

## 2023-02-08 ENCOUNTER — Encounter: Payer: Self-pay | Admitting: Student

## 2023-02-08 ENCOUNTER — Ambulatory Visit: Payer: PPO | Attending: Student | Admitting: Student

## 2023-02-08 VITALS — BP 114/68 | HR 68 | Ht 69.0 in | Wt 223.8 lb

## 2023-02-08 DIAGNOSIS — Z79899 Other long term (current) drug therapy: Secondary | ICD-10-CM | POA: Diagnosis not present

## 2023-02-08 DIAGNOSIS — I2510 Atherosclerotic heart disease of native coronary artery without angina pectoris: Secondary | ICD-10-CM | POA: Diagnosis not present

## 2023-02-08 DIAGNOSIS — E785 Hyperlipidemia, unspecified: Secondary | ICD-10-CM

## 2023-02-08 DIAGNOSIS — I5042 Chronic combined systolic (congestive) and diastolic (congestive) heart failure: Secondary | ICD-10-CM

## 2023-02-08 DIAGNOSIS — I10 Essential (primary) hypertension: Secondary | ICD-10-CM

## 2023-02-08 MED ORDER — NITROGLYCERIN 0.4 MG SL SUBL: 0.4000 mg | SUBLINGUAL_TABLET | SUBLINGUAL | 3 refills | Status: DC | PRN

## 2023-02-08 NOTE — Patient Instructions (Signed)
Medication Instructions:  Your physician recommends that you continue on your current medications as directed. Please refer to the Current Medication list given to you today.  *If you need a refill on your cardiac medications before your next appointment, please call your pharmacy*   Lab Work: Your physician recommends that you have the following lab drawn today: BMET  If you have labs (blood work) drawn today and your tests are completely normal, you will receive your results only by: MyChart Message (if you have MyChart) OR A paper copy in the mail If you have any lab test that is abnormal or we need to change your treatment, we will call you to review the results.   Testing/Procedures: NONE   Follow-Up: At [facility], you and your health needs are our priority.  As part of our continuing mission to provide you with exceptional heart care, we have created designated Provider Care Teams.  These Care Teams include your primary Cardiologist (physician) and Advanced Practice Providers (APPs -  Physician Assistants and Nurse Practitioners) who all work together to provide you with the care you need, when you need it.  We recommend signing up for the patient portal called "MyChart".  Sign up information is provided on this After Visit Summary.  MyChart is used to connect with patients for Virtual Visits (Telemedicine).  Patients are able to view lab/test results, encounter notes, upcoming appointments, etc.  Non-urgent messages can be sent to your provider as well.   To learn more about what you can do with MyChart, go to [email or web address].    Your next appointment:   3 month(s)  Provider:   Minus Breeding, MD

## 2023-02-09 LAB — BASIC METABOLIC PANEL
BUN/Creatinine Ratio: 24 (ref 10–24)
BUN: 32 mg/dL — ABNORMAL HIGH (ref 8–27)
CO2: 24 mmol/L (ref 20–29)
Calcium: 10.3 mg/dL — ABNORMAL HIGH (ref 8.6–10.2)
Chloride: 97 mmol/L (ref 96–106)
Creatinine, Ser: 1.35 mg/dL — ABNORMAL HIGH (ref 0.76–1.27)
Glucose: 114 mg/dL — ABNORMAL HIGH (ref 70–99)
Potassium: 5.5 mmol/L — ABNORMAL HIGH (ref 3.5–5.2)
Sodium: 138 mmol/L (ref 134–144)
eGFR: 56 mL/min/{1.73_m2} — ABNORMAL LOW (ref >59)

## 2023-02-18 ENCOUNTER — Telehealth: Payer: Self-pay | Admitting: *Deleted

## 2023-02-18 ENCOUNTER — Encounter (HOSPITAL_COMMUNITY): Payer: Self-pay

## 2023-02-18 DIAGNOSIS — R972 Elevated prostate specific antigen [PSA]: Secondary | ICD-10-CM | POA: Diagnosis not present

## 2023-02-18 NOTE — Telephone Encounter (Signed)
While getting intake information for cardiac rehab, patient stated that around 2 PM, he had chest pain 3/10 that stopped after one NTG. Called triage at [facility] and reported to S. Hassell Done, RN to see if Dr. Percival Spanish wants patient to attend cardiac rehab tomorrow.

## 2023-02-18 NOTE — Telephone Encounter (Signed)
Cardiac rehab nurse called today. She states  she spoke to the patient . He informed her that patient had chest pain  @ 2 pm  3 out of 10  with relief.    Patient is suppose to go to cardiac rehab on Tuesday at 10:30 am,  Cardiac Rehab- would like to know if patient should do rehab  tomorrow. Rn informed   Rehab nurse will send  message to Dr Percival Spanish , but he is out of the office today.

## 2023-02-19 ENCOUNTER — Encounter (HOSPITAL_COMMUNITY)
Admission: RE | Admit: 2023-02-19 | Discharge: 2023-02-19 | Disposition: A | Discharge status: Home / Self Care | Payer: PPO | Source: Ambulatory Visit | Attending: Cardiology | Admitting: Cardiology

## 2023-02-19 ENCOUNTER — Encounter (HOSPITAL_COMMUNITY): Payer: Self-pay

## 2023-02-19 VITALS — BP 104/74 | HR 67 | Ht 67.5 in | Wt 227.3 lb

## 2023-02-19 DIAGNOSIS — I5022 Chronic systolic (congestive) heart failure: Secondary | ICD-10-CM

## 2023-02-19 DIAGNOSIS — I2089 Other forms of angina pectoris: Secondary | ICD-10-CM | POA: Diagnosis not present

## 2023-02-19 LAB — GLUCOSE, CAPILLARY: Glucose-Capillary: 103 mg/dL — ABNORMAL HIGH (ref 70–99)

## 2023-02-19 NOTE — Progress Notes (Signed)
Cardiac Rehab Medication Review by a Nurse  Does the patient  feel that his/her medications are working for him/her?  yes  Has the patient been experiencing any side effects to the medications prescribed?  yes  Does the patient measure his/her own blood pressure or blood glucose at home?  yes   Does the patient have any problems obtaining medications due to transportation or finances?   no  Understanding of regimen: good Understanding of indications: good Potential of compliance: good    Nurse comments: Benjamin Erickson is taking his medications as prescribed and has a good understanding of what his medications are for. Benjamin Erickson reports that he feel lightheaded when he gets up in the morning or if he sits to long. Benjamin Erickson has a BP cuff he does not check his blood pressures that often.    Harrell Gave RN 02/19/2023 3:39 PM

## 2023-02-19 NOTE — Progress Notes (Addendum)
Patient reports that he feels lightheaded 50% of the time when getting up and feels dizzy when he sits for a long period of time. Bodin says he sometimes has to brace himself. Sitting BP 94/70. Standing BP 104/66. Telemetry rhythm Sinus. Patient reported taking a sublingual nitroglycerin. Yesterday after having chest pain that lasted 5-10 minutes. Dr Warren Lacy notified by instant message.  Good morning Dr Percival Spanish. Mr Radilla reported taking a sublingual nitroglycerin yesterday for chest pain with relief? Are you okay with him proceeding with orientation and exercise at cardiac rehab?  9:03 AM [facility] Minus Breeding, MD Yes   Will notify onsite provider Ambrose Pancoast NP about patients report of feeling lightheaded.  Onsite provider Ambrose Pancoast NP notified about patient complaints of feeling lightheaded. Jaquelyn Bitter recommended that Dr Percival Spanish be notified so that he review and evaluate whether his medications need to be adjusted.Harrell Gave RN BSN

## 2023-02-19 NOTE — Progress Notes (Signed)
Cardiac Individual Treatment Plan  Patient Details  Name: Benjamin Erickson MRN: [id number] Date of Birth: July 22, 1951 Referring Provider:   Flowsheet Row INTENSIVE CARDIAC REHAB ORIENT from 02/19/2023 in [facility] [place]  Referring Provider Minus Breeding, MD       Initial Encounter Date:  [place] from 02/19/2023 in [facility]  Date 02/19/23       Visit Diagnosis: Chronic stable angina  Chronic systolic heart failure  Patient's Home Medications on Admission:  Current Outpatient Medications:    acetaminophen (TYLENOL) 325 MG tablet, Take 650 mg by mouth as needed for moderate pain., Disp: , Rfl:    Alirocumab (PRALUENT) 150 MG/ML SOAJ, INJECT 150 MG INTO THE SKIN EVERY 14 (FOURTEEN) DAYS., Disp: 6 mL, Rfl: 3   aspirin EC 81 MG tablet, Take 81 mg by mouth daily. Swallow whole., Disp: , Rfl:    calcium elemental as carbonate (TUMS ULTRA 1000) 400 MG chewable tablet, Chew 2,000-3,000 mg by mouth as needed for heartburn., Disp: , Rfl:    carvedilol (COREG) 6.25 MG tablet, TAKE 1 TABLET BY MOUTH TWICE A DAY (Patient taking differently: Take 6.25 mg by mouth 2 (two) times daily with a meal.), Disp: 180 tablet, Rfl: 3   dapagliflozin propanediol (FARXIGA) 10 MG TABS tablet, Take 1 tablet (10 mg total) by mouth daily before breakfast., Disp: 90 tablet, Rfl: 3   furosemide (LASIX) 20 MG tablet, Take 1 tablet (20 mg total) by mouth daily. May take extra dose of lasix if has worsening dyspnea or if your weight increase by more than 3 lbs overnight or 5 lbs in a single week, Disp: 45 tablet, Rfl: 3   metFORMIN (GLUCOPHAGE) 500 MG tablet, Take 1 tablet (500 mg total) by mouth 2 (two) times daily., Disp: , Rfl:    nitroGLYCERIN (NITROSTAT) 0.4 MG SL tablet, Place 1 tablet (0.4 mg total) under the tongue every 5 (five) minutes as needed for chest pain.,  Disp: 90 tablet, Rfl: 3   rosuvastatin (CRESTOR) 20 MG tablet, Take 1 tablet (20 mg total) by mouth at bedtime., Disp: 90 tablet, Rfl: 1   sacubitril-valsartan (ENTRESTO) 49-51 MG, Take 1 tablet by mouth 2 (two) times daily., Disp: 60 tablet, Rfl: 3   spironolactone (ALDACTONE) 25 MG tablet, Take 0.5 tablets (12.5 mg total) by mouth daily., Disp: 30 tablet, Rfl: 2   tamsulosin (FLOMAX) 0.4 MG CAPS capsule, Take 0.4 mg by mouth daily., Disp: , Rfl:   Past Medical History: Past Medical History:  Diagnosis Date   Bigeminy    DM (diabetes mellitus)    Frequent PVCs    Hyperlipidemia    Hypertension    Nocturia     Tobacco Use: Social History   Tobacco Use  Smoking Status Former   Types: Cigarettes  Smokeless Tobacco Never  Tobacco Comments   Quit 40 years ago    Labs: Review Flowsheet  More data exists      Latest Ref Rng & Units 10/07/2020 01/31/2021 07/18/2022 01/27/2023 01/28/2023  Labs for ITP Cardiac and Pulmonary Rehab  Cholestrol 0 - 200 mg/dL 182  137  144  143  -  LDL (calc) 0 - 99 mg/dL 95  59  46  57  -  HDL-C >40 mg/dL 45  40  42  32  -  Trlycerides <150 mg/dL 250  240  381  272  -  Hemoglobin A1c 4.8 - 5.6 % - - - - 6.7     Capillary Blood Glucose: Lab Results  Component Value Date   GLUCAP 103 (H) 02/19/2023   GLUCAP 111 (H) 01/28/2023   GLUCAP 119 (H) 10/16/2022     Exercise Target Goals: Exercise Program Goal: Individual exercise prescription set using results from initial 6 min walk test and THRR while considering  patient's activity barriers and safety.   Exercise Prescription Goal: Initial exercise prescription builds to 30-45 minutes a day of aerobic activity, 2-3 days per week.  Home exercise guidelines will be given to patient during program as part of exercise prescription that the participant will acknowledge.  Activity Barriers & Risk Stratification:  Activity Barriers & Cardiac Risk Stratification - 02/19/23 1514       Activity Barriers &  Cardiac Risk Stratification   Activity Barriers Joint Problems;Deconditioning;Muscular Weakness;Balance Concerns;Decreased Ventricular Function;Shortness of Breath    Cardiac Risk Stratification High             6 Minute Walk:  6 Minute Walk     Row Name 02/19/23 1144         6 Minute Walk   Phase Initial     Distance 977 feet     Walk Time 6 minutes     # of Rest Breaks 0     MPH 1.9     METS 1.62     RPE 11     Perceived Dyspnea  1     VO2 Peak 5.7     Symptoms Yes (comment)     Comments SOB, RPD = 1, Bilateral leg pain 2/10     Resting HR 84 bpm     Resting BP 104/74     Resting Oxygen Saturation  91 %     Exercise Oxygen Saturation  during 6 min walk 97 %     Max Ex. HR 91 bpm     Max Ex. BP 110/80     2 Minute Post BP 106/68              Oxygen Initial Assessment:   Oxygen Re-Evaluation:   Oxygen Discharge (Final Oxygen Re-Evaluation):   Initial Exercise Prescription:  Initial Exercise Prescription - 02/19/23 1500       Date of Initial Exercise RX and Referring Provider   Date 02/19/23    Referring Provider Minus Breeding, MD    Expected Discharge Date 05/03/23      NuStep   Level 1    SPM 75    Minutes 20    METs 1.6      Prescription Details   Frequency (times per week) 3    Duration Progress to 30 minutes of continuous aerobic without signs/symptoms of physical distress      Intensity   THRR 40-80% of Max Heartrate 60-119    Ratings of Perceived Exertion 11-13    Perceived Dyspnea 0-4      Progression   Progression Continue progressive overload as per policy without signs/symptoms or physical distress.      Resistance Training   Training Prescription Yes    Weight 2 lbs    Reps 10-15             Perform Capillary Blood Glucose checks as needed.  Exercise Prescription Changes:   Exercise Comments:   Exercise Goals and Review:   Exercise Goals     Row Name 02/19/23 1521  Exercise Goals    Increase Physical Activity Yes       Intervention Provide advice, education, support and counseling about physical activity/exercise needs.;Develop an individualized exercise prescription for aerobic and resistive training based on initial evaluation findings, risk stratification, comorbidities and participant's personal goals.       Expected Outcomes Short Term: Attend rehab on a regular basis to increase amount of physical activity.;Long Term: Add in home exercise to make exercise part of routine and to increase amount of physical activity.;Long Term: Exercising regularly at least 3-5 days a week.       Increase Strength and Stamina Yes       Intervention Provide advice, education, support and counseling about physical activity/exercise needs.;Develop an individualized exercise prescription for aerobic and resistive training based on initial evaluation findings, risk stratification, comorbidities and participant's personal goals.       Expected Outcomes Short Term: Increase workloads from initial exercise prescription for resistance, speed, and METs.;Short Term: Perform resistance training exercises routinely during rehab and add in resistance training at home;Long Term: Improve cardiorespiratory fitness, muscular endurance and strength as measured by increased METs and functional capacity (6MWT)       Able to understand and use rate of perceived exertion (RPE) scale Yes       Intervention Provide education and explanation on how to use RPE scale       Expected Outcomes Short Term: Able to use RPE daily in rehab to express subjective intensity level;Long Term:  Able to use RPE to guide intensity level when exercising independently       Knowledge and understanding of Target Heart Rate Range (THRR) Yes       Intervention Provide education and explanation of THRR including how the numbers were predicted and where they are located for reference       Expected Outcomes Short Term: Able to state/look up  THRR;Short Term: Able to use daily as guideline for intensity in rehab;Long Term: Able to use THRR to govern intensity when exercising independently       Understanding of Exercise Prescription Yes       Intervention Provide education, explanation, and written materials on patient's individual exercise prescription       Expected Outcomes Short Term: Able to explain program exercise prescription;Long Term: Able to explain home exercise prescription to exercise independently                Exercise Goals Re-Evaluation :   Discharge Exercise Prescription (Final Exercise Prescription Changes):   Nutrition:  Target Goals: Understanding of nutrition guidelines, daily intake of sodium 1500mg , cholesterol 200mg , calories 30% from fat and 7% or less from saturated fats, daily to have 5 or more servings of fruits and vegetables.  Biometrics:  Pre Biometrics - 02/19/23 1330       Pre Biometrics   Waist Circumference 48.75 inches    Hip Circumference 45.5 inches    Waist to Hip Ratio 1.07 %    Triceps Skinfold 16 mm    % Body Fat 34.9 %    Grip Strength 35 kg    Flexibility 0 in   cannot reach   Single Leg Stand 3.5 seconds              Nutrition Therapy Plan and Nutrition Goals:   Nutrition Assessments:  MEDIFICTS Score Key: ?70 Need to make dietary changes  40-70 Heart Healthy Diet ? 40 Therapeutic Level Cholesterol Diet    Picture Your Plate Scores: [id number] Unhealthy  dietary pattern with much room for improvement. 41-50 Dietary pattern unlikely to meet recommendations for good health and room for improvement. 51-60 More healthful dietary pattern, with some room for improvement.  >60 Healthy dietary pattern, although there may be some specific behaviors that could be improved.    Nutrition Goals Re-Evaluation:   Nutrition Goals Re-Evaluation:   Nutrition Goals Discharge (Final Nutrition Goals Re-Evaluation):   Psychosocial: Target Goals: Acknowledge presence  or absence of significant depression and/or stress, maximize coping skills, provide positive support system. Participant is able to verbalize types and ability to use techniques and skills needed for reducing stress and depression.  Initial Review & Psychosocial Screening:  Initial Psych Review & Screening - 02/19/23 1525       Initial Review   Current issues with None Identified      Family Dynamics   Good Support System? Yes    Comments Has spouse for support      Barriers   Psychosocial barriers to participate in program There are no identifiable barriers or psychosocial needs.      Screening Interventions   Interventions Encouraged to exercise             Quality of Life Scores:  Quality of Life - 02/19/23 1524       Quality of Life   Select Quality of Life      Quality of Life Scores   Health/Function Pre 21.93 %    Socioeconomic Pre 29.17 %    Psych/Spiritual Pre 28.29 %    Family Pre 30 %    GLOBAL Pre 25.82 %            Scores of 19 and below usually indicate a poorer quality of life in these areas.  A difference of  2-3 points is a clinically meaningful difference.  A difference of 2-3 points in the total score of the Quality of Life Index has been associated with significant improvement in overall quality of life, self-image, physical symptoms, and general health in studies assessing change in quality of life.  PHQ-9: Review Flowsheet       02/19/2023 07/05/2016  Depression screen PHQ 2/9  Decreased Interest 0 0  Down, Depressed, Hopeless 0 0  PHQ - 2 Score 0 0  Altered sleeping 2 -  Tired, decreased energy 3 -  Change in appetite 0 -  Feeling bad or failure about yourself  0 -  Trouble concentrating 0 -  Moving slowly or fidgety/restless 0 -  Suicidal thoughts 0 -  PHQ-9 Score 5 -  Difficult doing work/chores Not difficult at all -   Interpretation of Total Score  Total Score Depression Severity:  1-4 = Minimal depression, 5-9 = Mild  depression, 10-14 = Moderate depression, 15-19 = Moderately severe depression, 20-27 = Severe depression   Psychosocial Evaluation and Intervention:   Psychosocial Re-Evaluation:   Psychosocial Discharge (Final Psychosocial Re-Evaluation):   Vocational Rehabilitation: Provide vocational rehab assistance to qualifying candidates.   Vocational Rehab Evaluation & Intervention:  Vocational Rehab - 02/19/23 1526       Initial Vocational Rehab Evaluation & Intervention   Assessment shows need for Vocational Rehabilitation No   Pt is retired            Education: Education Goals: Education classes will be provided on a weekly basis, covering required topics. Participant will state understanding/return demonstration of topics presented.     Core Videos: Exercise    Move It!  Clinical staff conducted group or  individual video education with verbal and written material and guidebook.  Patient learns the recommended Pritikin exercise program. Exercise with the goal of living a long, healthy life. Some of the health benefits of exercise include controlled diabetes, healthier blood pressure levels, improved cholesterol levels, improved heart and lung capacity, improved sleep, and better body composition. Everyone should speak with their doctor before starting or changing an exercise routine.  Biomechanical Limitations Clinical staff conducted group or individual video education with verbal and written material and guidebook.  Patient learns how biomechanical limitations can impact exercise and how we can mitigate and possibly overcome limitations to have an impactful and balanced exercise routine.  Body Composition Clinical staff conducted group or individual video education with verbal and written material and guidebook.  Patient learns that body composition (ratio of muscle mass to fat mass) is a key component to assessing overall fitness, rather than body weight alone. Increased fat  mass, especially visceral belly fat, can put [place] at increased risk for metabolic syndrome, type 2 diabetes, heart disease, and even death. It is recommended to combine diet and exercise (cardiovascular and resistance training) to improve your body composition. Seek guidance from your physician and exercise physiologist before implementing an exercise routine.  Exercise Action Plan Clinical staff conducted group or individual video education with verbal and written material and guidebook.  Patient learns the recommended strategies to achieve and enjoy long-term exercise adherence, including variety, self-motivation, self-efficacy, and positive decision making. Benefits of exercise include fitness, good health, weight management, more energy, better sleep, less stress, and overall well-being.  Medical   Heart Disease Risk Reduction Clinical staff conducted group or individual video education with verbal and written material and guidebook.  Patient learns our heart is our most vital organ as it circulates oxygen, nutrients, white blood cells, and hormones throughout the entire body, and carries waste away. Data supports a plant-based eating plan like the Pritikin Program for its effectiveness in slowing progression of and reversing heart disease. The video provides a number of recommendations to address heart disease.   Metabolic Syndrome and Belly Fat  Clinical staff conducted group or individual video education with verbal and written material and guidebook.  Patient learns what metabolic syndrome is, how it leads to heart disease, and how one can reverse it and keep it from coming back. You have metabolic syndrome if you have 3 of the following 5 criteria: abdominal obesity, high blood pressure, high triglycerides, low HDL cholesterol, and high blood sugar.  Hypertension and Heart Disease Clinical staff conducted group or individual video education with verbal and written material and guidebook.   Patient learns that high blood pressure, or hypertension, is very common in the [place]. Hypertension is largely due to excessive salt intake, but other important risk factors include being overweight, physical inactivity, drinking too much alcohol, smoking, and not eating enough potassium from fruits and vegetables. High blood pressure is a leading risk factor for heart attack, stroke, congestive heart failure, dementia, kidney failure, and premature death. Long-term effects of excessive salt intake include stiffening of the arteries and thickening of heart muscle and organ damage. Recommendations include ways to reduce hypertension and the risk of heart disease.  Diseases of Our Time - Focusing on Diabetes Clinical staff conducted group or individual video education with verbal and written material and guidebook.  Patient learns why the best way to stop diseases of our time is prevention, through food and other lifestyle changes. Medicine (such as prescription pills and  surgeries) is often only a Band-Aid on the problem, not a long-term solution. Most common diseases of our time include obesity, type 2 diabetes, hypertension, heart disease, and cancer. The Pritikin Program is recommended and has been proven to help reduce, reverse, and/or prevent the damaging effects of metabolic syndrome.  Nutrition   Overview of the Pritikin Eating Plan  Clinical staff conducted group or individual video education with verbal and written material and guidebook.  Patient learns about the [place] for disease risk reduction. The [place] emphasizes a wide variety of unrefined, minimally-processed carbohydrates, like fruits, vegetables, whole grains, and legumes. Go, Caution, and Stop food choices are explained. Plant-based and lean animal proteins are emphasized. Rationale provided for low sodium intake for blood pressure control, low added sugars for blood sugar stabilization, and low  added fats and oils for coronary artery disease risk reduction and weight management.  Calorie Density  Clinical staff conducted group or individual video education with verbal and written material and guidebook.  Patient learns about calorie density and how it impacts the Pritikin Eating Plan. Knowing the characteristics of the food you choose will help you decide whether those foods will lead to weight gain or weight loss, and whether you want to consume more or less of them. Weight loss is usually a side effect of the Pritikin Eating Plan because of its focus on low calorie-dense foods.  Label Reading  Clinical staff conducted group or individual video education with verbal and written material and guidebook.  Patient learns about the Pritikin recommended label reading guidelines and corresponding recommendations regarding calorie density, added sugars, sodium content, and whole grains.  Dining Out - Part 1  Clinical staff conducted group or individual video education with verbal and written material and guidebook.  Patient learns that restaurant meals can be sabotaging because they can be so high in calories, fat, sodium, and/or sugar. Patient learns recommended strategies on how to positively address this and avoid unhealthy pitfalls.  Facts on Fats  Clinical staff conducted group or individual video education with verbal and written material and guidebook.  Patient learns that lifestyle modifications can be just as effective, if not more so, as many medications for lowering your risk of heart disease. A Pritikin lifestyle can help to reduce your risk of inflammation and atherosclerosis (cholesterol build-up, or plaque, in the artery walls). Lifestyle interventions such as dietary choices and physical activity address the cause of atherosclerosis. A review of the types of fats and their impact on blood cholesterol levels, along with dietary recommendations to reduce fat intake is also  included.  Nutrition Action Plan  Clinical staff conducted group or individual video education with verbal and written material and guidebook.  Patient learns how to incorporate Pritikin recommendations into their lifestyle. Recommendations include planning and keeping personal health goals in mind as an important part of their success.  Healthy Mind-Set    Healthy Minds, Bodies, Hearts  Clinical staff conducted group or individual video education with verbal and written material and guidebook.  Patient learns how to identify when they are stressed. Video will discuss the impact of that stress, as well as the many benefits of stress management. Patient will also be introduced to stress management techniques. The way we think, act, and feel has an impact on our hearts.  How Our Thoughts Can Heal Our Hearts  Clinical staff conducted group or individual video education with verbal and written material and guidebook.  Patient learns that negative thoughts  can cause depression and anxiety. This can result in negative lifestyle behavior and serious health problems. Cognitive behavioral therapy is an effective method to help control our thoughts in order to change and improve our emotional outlook.  Additional Videos:  Exercise    Improving Performance  Clinical staff conducted group or individual video education with verbal and written material and guidebook.  Patient learns to use a non-linear approach by alternating intensity levels and lengths of time spent exercising to help burn more calories and lose more body fat. Cardiovascular exercise helps improve heart health, metabolism, hormonal balance, blood sugar control, and recovery from fatigue. Resistance training improves strength, endurance, balance, coordination, reaction time, metabolism, and muscle mass. Flexibility exercise improves circulation, posture, and balance. Seek guidance from your physician and exercise physiologist before  implementing an exercise routine and learn your capabilities and proper form for all exercise.  Introduction to Yoga  Clinical staff conducted group or individual video education with verbal and written material and guidebook.  Patient learns about yoga, a discipline of the coming together of mind, breath, and body. The benefits of yoga include improved flexibility, improved range of motion, better posture and core strength, increased lung function, weight loss, and positive self-image. Yoga's heart health benefits include lowered blood pressure, healthier heart rate, decreased cholesterol and triglyceride levels, improved immune function, and reduced stress. Seek guidance from your physician and exercise physiologist before implementing an exercise routine and learn your capabilities and proper form for all exercise.  Medical   Aging: Enhancing Your Quality of Life  Clinical staff conducted group or individual video education with verbal and written material and guidebook.  Patient learns key strategies and recommendations to stay in good physical health and enhance quality of life, such as prevention strategies, having an advocate, securing a [place], and keeping a list of medications and system for tracking them. It also discusses how to avoid risk for bone loss.  Biology of Weight Control  Clinical staff conducted group or individual video education with verbal and written material and guidebook.  Patient learns that weight gain occurs because we consume more calories than we burn (eating more, moving less). Even if your body weight is normal, you may have higher ratios of fat compared to muscle mass. Too much body fat puts you at increased risk for cardiovascular disease, heart attack, stroke, type 2 diabetes, and obesity-related cancers. In addition to exercise, following the [place] can help reduce your risk.  Decoding Lab Results  Clinical staff  conducted group or individual video education with verbal and written material and guidebook.  Patient learns that lab test reflects one measurement whose values change over time and are influenced by many factors, including medication, stress, sleep, exercise, food, hydration, pre-existing medical conditions, and more. It is recommended to use the knowledge from this video to become more involved with your lab results and evaluate your numbers to speak with your doctor.   Diseases of Our Time - Overview  Clinical staff conducted group or individual video education with verbal and written material and guidebook.  Patient learns that according to the CDC, 50% to 70% of chronic diseases (such as obesity, type 2 diabetes, elevated lipids, hypertension, and heart disease) are avoidable through lifestyle improvements including healthier food choices, listening to satiety cues, and increased physical activity.  Sleep Disorders Clinical staff conducted group or individual video education with verbal and written material and guidebook.  Patient learns how good  quality and duration of sleep are important to overall health and well-being. Patient also learns about sleep disorders and how they impact health along with recommendations to address them, including discussing with a physician.  Nutrition  Dining Out - Part 2 Clinical staff conducted group or individual video education with verbal and written material and guidebook.  Patient learns how to plan ahead and communicate in order to maximize their dining experience in a healthy and nutritious manner. Included are recommended food choices based on the type of restaurant the patient is visiting.   Fueling a Best boy conducted group or individual video education with verbal and written material and guidebook.  There is a strong connection between our food choices and our health. Diseases like obesity and type 2 diabetes are very  prevalent and are in large-part due to lifestyle choices. The Pritikin Eating Plan provides plenty of food and hunger-curbing satisfaction. It is easy to follow, affordable, and helps reduce health risks.  Menu Workshop  Clinical staff conducted group or individual video education with verbal and written material and guidebook.  Patient learns that restaurant meals can sabotage health goals because they are often packed with calories, fat, sodium, and sugar. Recommendations include strategies to plan ahead and to communicate with the manager, chef, or server to help order a healthier meal.  Planning Your Eating Strategy  Clinical staff conducted group or individual video education with verbal and written material and guidebook.  Patient learns about the [place] and its benefit of reducing the risk of disease. The [place] does not focus on calories. Instead, it emphasizes high-quality, nutrient-rich foods. By knowing the characteristics of the foods, we choose, we can determine their calorie density and make informed decisions.  Targeting Your Nutrition Priorities  Clinical staff conducted group or individual video education with verbal and written material and guidebook.  Patient learns that lifestyle habits have a tremendous impact on disease risk and progression. This video provides eating and physical activity recommendations based on your personal health goals, such as reducing LDL cholesterol, losing weight, preventing or controlling type 2 diabetes, and reducing high blood pressure.  Vitamins and Minerals  Clinical staff conducted group or individual video education with verbal and written material and guidebook.  Patient learns different ways to obtain key vitamins and minerals, including through a recommended healthy diet. It is important to discuss all supplements you take with your doctor.   Healthy Mind-Set    Smoking Cessation  Clinical staff conducted group  or individual video education with verbal and written material and guidebook.  Patient learns that cigarette smoking and tobacco addiction pose a serious health risk which affects millions of people. Stopping smoking will significantly reduce the risk of heart disease, lung disease, and many forms of cancer. Recommended strategies for quitting are covered, including working with your doctor to develop a successful plan.  Culinary   Becoming a Financial trader conducted group or individual video education with verbal and written material and guidebook.  Patient learns that cooking at home can be healthy, cost-effective, quick, and puts them in control. Keys to cooking healthy recipes will include looking at your recipe, assessing your equipment needs, planning ahead, making it simple, choosing cost-effective seasonal ingredients, and limiting the use of added fats, salts, and sugars.  Cooking - Breakfast and Snacks  Clinical staff conducted group or individual video education with verbal and written material and guidebook.  Patient learns how important  breakfast is to satiety and nutrition through the entire day. Recommendations include key foods to eat during breakfast to help stabilize blood sugar levels and to prevent overeating at meals later in the day. Planning ahead is also a key component.  Cooking - Human resources officer conducted group or individual video education with verbal and written material and guidebook.  Patient learns eating strategies to improve overall health, including an approach to cook more at home. Recommendations include thinking of animal protein as a side on your plate rather than center stage and focusing instead on lower calorie dense options like vegetables, fruits, whole grains, and plant-based proteins, such as beans. Making sauces in large quantities to freeze for later and leaving the skin on your vegetables are also recommended to maximize  your experience.  Cooking - Healthy Salads and Dressing Clinical staff conducted group or individual video education with verbal and written material and guidebook.  Patient learns that vegetables, fruits, whole grains, and legumes are the foundations of the [place]. Recommendations include how to incorporate each of these in flavorful and healthy salads, and how to create homemade salad dressings. Proper handling of ingredients is also covered. Cooking - Soups and [employer] - Soups and Desserts Clinical staff conducted group or individual video education with verbal and written material and guidebook.  Patient learns that Pritikin soups and desserts make for easy, nutritious, and delicious snacks and meal components that are low in sodium, fat, sugar, and calorie density, while high in vitamins, minerals, and filling fiber. Recommendations include simple and healthy ideas for soups and desserts.   Overview     The Pritikin Solution Program Overview Clinical staff conducted group or individual video education with verbal and written material and guidebook.  Patient learns that the results of the [place] Program have been documented in more than 100 articles published in peer-reviewed journals, and the benefits include reducing risk factors for (and, in some cases, even reversing) high cholesterol, high blood pressure, type 2 diabetes, obesity, and more! An overview of the three key pillars of the Pritikin Program will be covered: eating well, doing regular exercise, and having a healthy mind-set.  WORKSHOPS  Exercise: Exercise Basics: Building Your Action Plan Clinical staff led group instruction and group discussion with PowerPoint presentation and patient guidebook. To enhance the learning environment the use of posters, models and videos may be added. At the conclusion of this workshop, patients will comprehend the difference between physical activity and exercise, as well  as the benefits of incorporating both, into their routine. Patients will understand the FITT (Frequency, Intensity, Time, and Type) principle and how to use it to build an exercise action plan. In addition, safety concerns and other considerations for exercise and cardiac rehab will be addressed by the presenter. The purpose of this lesson is to promote a comprehensive and effective weekly exercise routine in order to improve patients' overall level of fitness.   Managing Heart Disease: Your Path to a Healthier Heart Clinical staff led group instruction and group discussion with PowerPoint presentation and patient guidebook. To enhance the learning environment the use of posters, models and videos may be added.At the conclusion of this workshop, patients will understand the anatomy and physiology of the heart. Additionally, they will understand how Pritikin's three pillars impact the risk factors, the progression, and the management of heart disease.  The purpose of this lesson is to provide a high-level overview of the heart, heart disease, and  how the Pritikin lifestyle positively impacts risk factors.  Exercise Biomechanics Clinical staff led group instruction and group discussion with PowerPoint presentation and patient guidebook. To enhance the learning environment the use of posters, models and videos may be added. Patients will learn how the structural parts of their bodies function and how these functions impact their daily activities, movement, and exercise. Patients will learn how to promote a neutral spine, learn how to manage pain, and identify ways to improve their physical movement in order to promote healthy living. The purpose of this lesson is to expose patients to common physical limitations that impact physical activity. Participants will learn practical ways to adapt and manage aches and pains, and to minimize their effect on regular exercise. Patients will learn how to  maintain good posture while sitting, walking, and lifting.  Balance Training and Fall Prevention  Clinical staff led group instruction and group discussion with PowerPoint presentation and patient guidebook. To enhance the learning environment the use of posters, models and videos may be added. At the conclusion of this workshop, patients will understand the importance of their sensorimotor skills (vision, proprioception, and the vestibular system) in maintaining their ability to balance as they age. Patients will apply a variety of balancing exercises that are appropriate for their current level of function. Patients will understand the common causes for poor balance, possible solutions to these problems, and ways to modify their physical environment in order to minimize their fall risk. The purpose of this lesson is to teach patients about the importance of maintaining balance as they age and ways to minimize their risk of falling.  WORKSHOPS   Nutrition:  Fueling a Scientist, research (physical sciences) led group instruction and group discussion with PowerPoint presentation and patient guidebook. To enhance the learning environment the use of posters, models and videos may be added. Patients will review the foundational principles of the [place] and understand what constitutes a serving size in each of the food groups. Patients will also learn Pritikin-friendly foods that are better choices when away from home and review make-ahead meal and snack options. Calorie density will be reviewed and applied to three nutrition priorities: weight maintenance, weight loss, and weight gain. The purpose of this lesson is to reinforce (in a group setting) the key concepts around what patients are recommended to eat and how to apply these guidelines when away from home by planning and selecting Pritikin-friendly options. Patients will understand how calorie density may be adjusted for different weight management  goals.  Mindful Eating  Clinical staff led group instruction and group discussion with PowerPoint presentation and patient guidebook. To enhance the learning environment the use of posters, models and videos may be added. Patients will briefly review the concepts of the [place] and the importance of low-calorie dense foods. The concept of mindful eating will be introduced as well as the importance of paying attention to internal hunger signals. Triggers for non-hunger eating and techniques for dealing with triggers will be explored. The purpose of this lesson is to provide patients with the opportunity to review the basic principles of the [place], discuss the value of eating mindfully and how to measure internal cues of hunger and fullness using the Hunger Scale. Patients will also discuss reasons for non-hunger eating and learn strategies to use for controlling emotional eating.  Targeting Your Nutrition Priorities Clinical staff led group instruction and group discussion with PowerPoint presentation and patient guidebook. To enhance the learning environment  the use of posters, models and videos may be added. Patients will learn how to determine their genetic susceptibility to disease by reviewing their family history. Patients will gain insight into the importance of diet as part of an overall healthy lifestyle in mitigating the impact of genetics and other environmental insults. The purpose of this lesson is to provide patients with the opportunity to assess their personal nutrition priorities by looking at their family history, their own health history and current risk factors. Patients will also be able to discuss ways of prioritizing and modifying the [place] for their highest risk areas  Menu  Clinical staff led group instruction and group discussion with PowerPoint presentation and patient guidebook. To enhance the learning environment the use of posters,  models and videos may be added. Using menus brought in from [employer], or printed from [employer], patients will apply the [place] dining out guidelines that were presented in the [employer] video. Patients will also be able to practice these guidelines in a variety of provided scenarios. The purpose of this lesson is to provide patients with the opportunity to practice hands-on learning of the [place] with actual menus and practice scenarios.  Label Reading Clinical staff led group instruction and group discussion with PowerPoint presentation and patient guidebook. To enhance the learning environment the use of posters, models and videos may be added. Patients will review and discuss the Pritikin label reading guidelines presented in Pritikin's Label Reading Educational series video. Using fool labels brought in from local grocery stores and markets, patients will apply the label reading guidelines and determine if the packaged food meet the Pritikin guidelines. The purpose of this lesson is to provide patients with the opportunity to review, discuss, and practice hands-on learning of the Pritikin Label Reading guidelines with actual packaged food labels. [place] Workshops are designed to teach patients ways to prepare quick, simple, and affordable recipes at home. The importance of nutrition's role in chronic disease risk reduction is reflected in its emphasis in the overall Pritikin program. By learning how to prepare essential core Pritikin Eating Plan recipes, patients will increase control over what they eat; be able to customize the flavor of foods without the use of added salt, sugar, or fat; and improve the quality of the food they consume. By learning a set of core recipes which are easily assembled, quickly prepared, and affordable, patients are more likely to prepare more healthy foods at home. These workshops  focus on convenient breakfasts, simple entres, side dishes, and desserts which can be prepared with minimal effort and are consistent with nutrition recommendations for cardiovascular risk reduction. Cooking [employer] are taught by a Engineer, materials (RD) who has been trained by the [employer]. The chef or RD has a clear understanding of the importance of minimizing - if not completely eliminating - added fat, sugar, and sodium in recipes. Throughout the series of [place] Workshop sessions, patients will learn about healthy ingredients and efficient methods of cooking to build confidence in their capability to prepare    Cooking School weekly topics:  Adding Flavor- Sodium-Free  Fast and Healthy Breakfasts  Powerhouse Plant-Based Proteins  Satisfying Salads and Dressings  Simple Sides and Sauces  International Cuisine-Spotlight on the [place] Zones  Delicious Desserts  Savory Soups  [employer] - Meals in a Agricultural consultant Appetizers and Snacks  Comforting Weekend Breakfasts  One-Pot Wonders  Fast Evening Meals  Easy Entertaining  Personalizing Your Pritikin Plate  WORKSHOPS   Healthy Mindset (Psychosocial):  Focused Goals, Sustainable Changes Clinical staff led group instruction and group discussion with PowerPoint presentation and patient guidebook. To enhance the learning environment the use of posters, models and videos may be added. Patients will be able to apply effective goal setting strategies to establish at least one personal goal, and then take consistent, meaningful action toward that goal. They will learn to identify common barriers to achieving personal goals and develop strategies to overcome them. Patients will also gain an understanding of how our mind-set can impact our ability to achieve goals and the importance of cultivating a positive and growth-oriented mind-set. The purpose of this lesson is to provide patients with a deeper  understanding of how to set and achieve personal goals, as well as the tools and strategies needed to overcome common obstacles which may arise along the way.  From Head to Heart: The Power of a Healthy Outlook  Clinical staff led group instruction and group discussion with PowerPoint presentation and patient guidebook. To enhance the learning environment the use of posters, models and videos may be added. Patients will be able to recognize and describe the impact of emotions and mood on physical health. They will discover the importance of self-care and explore self-care practices which may work for them. Patients will also learn how to utilize the 4 C's to cultivate a healthier outlook and better manage stress and challenges. The purpose of this lesson is to demonstrate to patients how a healthy outlook is an essential part of maintaining good health, especially as they continue their cardiac rehab journey.  Healthy Sleep for a Healthy Heart Clinical staff led group instruction and group discussion with PowerPoint presentation and patient guidebook. To enhance the learning environment the use of posters, models and videos may be added. At the conclusion of this workshop, patients will be able to demonstrate knowledge of the importance of sleep to overall health, well-being, and quality of life. They will understand the symptoms of, and treatments for, common sleep disorders. Patients will also be able to identify daytime and nighttime behaviors which impact sleep, and they will be able to apply these tools to help manage sleep-related challenges. The purpose of this lesson is to provide patients with a general overview of sleep and outline the importance of quality sleep. Patients will learn about a few of the most common sleep disorders. Patients will also be introduced to the concept of "sleep hygiene," and discover ways to self-manage certain sleeping problems through simple daily behavior changes.  Finally, the workshop will motivate patients by clarifying the links between quality sleep and their goals of heart-healthy living.   Recognizing and Reducing Stress Clinical staff led group instruction and group discussion with PowerPoint presentation and patient guidebook. To enhance the learning environment the use of posters, models and videos may be added. At the conclusion of this workshop, patients will be able to understand the types of stress reactions, differentiate between acute and chronic stress, and recognize the impact that chronic stress has on their health. They will also be able to apply different coping mechanisms, such as reframing negative self-talk. Patients will have the opportunity to practice a variety of stress management techniques, such as deep abdominal breathing, progressive muscle relaxation, and/or guided imagery.  The purpose of this lesson is to educate patients on the role of stress in their lives and to provide healthy techniques for coping  with it.  Learning Barriers/Preferences:  Learning Barriers/Preferences - 02/19/23 1528       Learning Barriers/Preferences   Learning Barriers Sight;Hearing   wears glasses; HOH left ear   Learning Preferences Written Material;Computer/Internet;Pictoral;Video             Education Topics:  Knowledge Questionnaire Score:  Knowledge Questionnaire Score - 02/19/23 1527       Knowledge Questionnaire Score   Pre Score 20/24             Core Components/Risk Factors/Patient Goals at Admission:  Personal Goals and Risk Factors at Admission - 02/19/23 1526       Core Components/Risk Factors/Patient Goals on Admission    Weight Management Yes;Obesity;Weight Loss    Intervention Weight Management: Develop a combined nutrition and exercise program designed to reach desired caloric intake, while maintaining appropriate intake of nutrient and fiber, sodium and fats, and appropriate energy expenditure required for the  weight goal.;Weight Management: Provide education and appropriate resources to help participant work on and attain dietary goals.;Weight Management/Obesity: Establish reasonable short term and long term weight goals.;Obesity: Provide education and appropriate resources to help participant work on and attain dietary goals.    Admit Weight 227 lb 4.7 oz (103.1 kg)    Goal Weight: Long Term 200 lb (90.7 kg)    Expected Outcomes Short Term: Continue to assess and modify interventions until short term weight is achieved;Long Term: Adherence to nutrition and physical activity/exercise program aimed toward attainment of established weight goal;Weight Loss: Understanding of general recommendations for a balanced deficit meal plan, which promotes 1-2 lb weight loss per week and includes a negative energy balance of [phone] kcal/d;Understanding recommendations for meals to include 15-35% energy as protein, 25-35% energy from fat, 35-60% energy from carbohydrates, less than 200mg  of dietary cholesterol, 20-35 gm of total fiber daily;Understanding of distribution of calorie intake throughout the day with the consumption of 4-5 meals/snacks    Diabetes Yes    Intervention Provide education about signs/symptoms and action to take for hypo/hyperglycemia.;Provide education about proper nutrition, including hydration, and aerobic/resistive exercise prescription along with prescribed medications to achieve blood glucose in normal ranges: Fasting glucose 65-99 mg/dL    Expected Outcomes Short Term: Participant verbalizes understanding of the signs/symptoms and immediate care of hyper/hypoglycemia, proper foot care and importance of medication, aerobic/resistive exercise and nutrition plan for blood glucose control.;Long Term: Attainment of HbA1C < 7%.    Heart Failure Yes    Intervention Provide a combined exercise and nutrition program that is supplemented with education, support and counseling about heart failure. Directed  toward relieving symptoms such as shortness of breath, decreased exercise tolerance, and extremity edema.    Expected Outcomes Long term: Adoption of self-care skills and reduction of barriers for early signs and symptoms recognition and intervention leading to self-care maintenance.;Short term: Daily weights obtained and reported for increase. Utilizing diuretic protocols set by physician.;Short term: Attendance in program 2-3 days a week with increased exercise capacity. Reported lower sodium intake. Reported increased fruit and vegetable intake. Reports medication compliance.    Hypertension Yes    Intervention Provide education on lifestyle modifcations including regular physical activity/exercise, weight management, moderate sodium restriction and increased consumption of fresh fruit, vegetables, and low fat dairy, alcohol moderation, and smoking cessation.;Monitor prescription use compliance.    Expected Outcomes Short Term: Continued assessment and intervention until BP is < 140/87mm HG in hypertensive participants. < 130/63mm HG in hypertensive participants with diabetes, heart failure or chronic kidney disease.;Long  Term: Maintenance of blood pressure at goal levels.    Lipids Yes    Intervention Provide education and support for participant on nutrition & aerobic/resistive exercise along with prescribed medications to achieve LDL 70mg , HDL >40mg .    Expected Outcomes Short Term: Participant states understanding of desired cholesterol values and is compliant with medications prescribed. Participant is following exercise prescription and nutrition guidelines.;Long Term: Cholesterol controlled with medications as prescribed, with individualized exercise RX and with personalized nutrition plan. Value goals: LDL < 70mg , HDL > 40 mg.             Core Components/Risk Factors/Patient Goals Review:    Core Components/Risk Factors/Patient Goals at Discharge (Final Review):    ITP Comments:   ITP Comments     Row Name 02/19/23 1043           ITP Comments Fransico Him, MD: Medical Director.  Intorduction to the [place] [employer].  Initial orientation packet reviewed with the patient.                Comments: Participant attended orientation for the cardiac rehabilitation program on  02/19/2023  to perform initial intake and exercise walk test. Patient introduced to the [facility] education and orientation packet was reviewed. Completed 6-minute walk test, measurements, initial ITP, and exercise prescription. Vital signs stable. Telemetry-normal sinus rhythm, Romil reported mild shortness of breath and chronic bilateral leg pain 2/10. Patient denied having any chest pain today at orientation or during walk test. Harrell Gave RN BSN   Service time was from 1025- to 1213.

## 2023-02-20 ENCOUNTER — Other Ambulatory Visit: Payer: Self-pay

## 2023-02-20 DIAGNOSIS — Z79899 Other long term (current) drug therapy: Secondary | ICD-10-CM

## 2023-02-20 MED ORDER — SPIRONOLACTONE 25 MG PO TABS: 12.5000 mg | ORAL_TABLET | ORAL | 2 refills | Status: DC

## 2023-02-20 NOTE — Telephone Encounter (Addendum)
Yes.  We are continuing medical management and I would want him to continue with rehab.  [facility]

## 2023-02-20 NOTE — Telephone Encounter (Signed)
RN called Cardiac Rehab spoke to [facility] - information given per Dr Percival Spanish . Verbalized  understanding.

## 2023-02-21 DIAGNOSIS — Z85828 Personal history of other malignant neoplasm of skin: Secondary | ICD-10-CM | POA: Diagnosis not present

## 2023-02-21 DIAGNOSIS — L570 Actinic keratosis: Secondary | ICD-10-CM | POA: Diagnosis not present

## 2023-02-21 DIAGNOSIS — L821 Other seborrheic keratosis: Secondary | ICD-10-CM | POA: Diagnosis not present

## 2023-02-21 DIAGNOSIS — D225 Melanocytic nevi of trunk: Secondary | ICD-10-CM | POA: Diagnosis not present

## 2023-02-21 DIAGNOSIS — L814 Other melanin hyperpigmentation: Secondary | ICD-10-CM | POA: Diagnosis not present

## 2023-02-21 DIAGNOSIS — D485 Neoplasm of uncertain behavior of skin: Secondary | ICD-10-CM | POA: Diagnosis not present

## 2023-02-21 DIAGNOSIS — Z08 Encounter for follow-up examination after completed treatment for malignant neoplasm: Secondary | ICD-10-CM | POA: Diagnosis not present

## 2023-02-21 DIAGNOSIS — C44612 Basal cell carcinoma of skin of right upper limb, including shoulder: Secondary | ICD-10-CM | POA: Diagnosis not present

## 2023-02-25 ENCOUNTER — Encounter (HOSPITAL_COMMUNITY)
Admission: RE | Admit: 2023-02-25 | Discharge: 2023-02-25 | Disposition: A | Discharge status: Home / Self Care | Payer: PPO | Source: Ambulatory Visit | Attending: Cardiology | Admitting: Cardiology

## 2023-02-25 ENCOUNTER — Telehealth: Payer: Self-pay | Admitting: Cardiology

## 2023-02-25 DIAGNOSIS — N5201 Erectile dysfunction due to arterial insufficiency: Secondary | ICD-10-CM | POA: Diagnosis not present

## 2023-02-25 DIAGNOSIS — R3912 Poor urinary stream: Secondary | ICD-10-CM | POA: Diagnosis not present

## 2023-02-25 DIAGNOSIS — I2089 Other forms of angina pectoris: Secondary | ICD-10-CM

## 2023-02-25 DIAGNOSIS — R972 Elevated prostate specific antigen [PSA]: Secondary | ICD-10-CM | POA: Diagnosis not present

## 2023-02-25 DIAGNOSIS — I5022 Chronic systolic (congestive) heart failure: Secondary | ICD-10-CM

## 2023-02-25 DIAGNOSIS — N401 Enlarged prostate with lower urinary tract symptoms: Secondary | ICD-10-CM | POA: Diagnosis not present

## 2023-02-25 LAB — GLUCOSE, CAPILLARY
Glucose-Capillary: 147 mg/dL — ABNORMAL HIGH (ref 70–99)
Glucose-Capillary: 115 mg/dL — ABNORMAL HIGH (ref 70–99)

## 2023-02-25 NOTE — Progress Notes (Signed)
Daily Session Note  Patient Details  Name: Benjamin Erickson MRN: [id number] Date of Birth: [birth year] Referring Provider:   Flowsheet Row INTENSIVE CARDIAC REHAB ORIENT from 02/19/2023 in [facility] [place] [facility]  Referring Provider Rollene Rotunda, MD       Encounter Date: 02/25/2023  Check In:  Session Check In - 02/25/23 1345       Check-In   Supervising physician immediately available to respond to emergencies [facility] MD immediately available    Physician(s) Gwendolyn Lima, NP    Location [facility]    Staff Present Gladstone Lighter, RN, Marton Redwood, MS, ACSM-CEP, CCRP, Exercise Physiologist;Johnny Hale Bogus, MS, Exercise Physiologist;Olinty Peggye Pitt, MS, ACSM-CEP, Exercise Physiologist;Jetta Walker BS, ACSM-CEP, Exercise Physiologist    Virtual Visit No    Medication changes reported     No    Fall or balance concerns reported    No    Tobacco Cessation No Change    Warm-up and Cool-down Performed as group-led instruction   CRP2 orientation today   Resistance Training Performed Yes    VAD Patient? No    PAD/SET Patient? No      Pain Assessment   Currently in Pain? No/denies    Pain Score 0-No pain    Multiple Pain Sites No             Capillary Blood Glucose: No results found for this or any previous visit (from the past 24 hour(s)).   Exercise Prescription Changes - 02/25/23 1400       Response to Exercise   Blood Pressure (Admit) 108/70    Blood Pressure (Exercise) 118/70    Blood Pressure (Exit) 110/70    Heart Rate (Admit) 69 bpm    Heart Rate (Exercise) 89 bpm    Heart Rate (Exit) 78 bpm    Rating of Perceived Exertion (Exercise) 10    Symptoms SOB, lightheadedness    Comments Pt's first day in the CRP2 program    Duration Progress to 30 minutes of  aerobic without signs/symptoms of physical distress    Intensity THRR unchanged      Progression   Progression Continue to progress workloads  to maintain intensity without signs/symptoms of physical distress.    Average METs 1.7      Resistance Training   Training Prescription Yes    Weight 2 lbs    Reps 10-15    Time 10 Minutes      Interval Training   Interval Training No      NuStep   Level 1    Minutes 20    METs 1.7             Social History   Tobacco Use  Smoking Status Former   Types: Cigarettes  Smokeless Tobacco Never  Tobacco Comments   Quit 40 years ago    Goals Met:  Strength training completed today  Goals Unmet:  Not Applicable  Comments:Pt started cardiac rehab today.  Pt tolerated light exercise without difficulty. VSS, telemetry-Sinus Rhythm with PVC's, Benjamin Erickson reported feeling lightheaded after getting off the nustep. Orthostatic BP's checked.  Medication list reconciled. Pt denies barriers to medicaiton compliance.  PSYCHOSOCIAL ASSESSMENT:  PHQ-5. Pt exhibits positive coping skills, hopeful outlook with supportive family. No psychosocial needs identified at this time, no psychosocial interventions necessary.    Pt enjoys gardening.   Pt oriented to exercise equipment and routine.    Understanding verbalized.Thayer Headings RN BSN  Dr. Traci Turner is Medical Director for Cardiac Rehab at [facility]. 

## 2023-02-25 NOTE — Telephone Encounter (Signed)
Called Byrd Hesselbach back and spoke with her and Mr. Kos in regards to Benjamin Coder, NP medication changes and his upcoming lab work.   Here is DW's wishes : "Please let patient know his potassium is still high. This is likely from the spironolactone. I would like him to stop it for 3 days then begin taking it every other day thereafter. Repeat BMP in 2 weeks. Thank you! DW"  I remember speaking with this patient earlier last week, but the patient doesn't remember so we went over the information again and he gave a verbal understanding. I updated the medication with the changes in his chart last week when I gave him the information.   Everybody is on the same page now.

## 2023-02-25 NOTE — Telephone Encounter (Signed)
Byrd Hesselbach is calling from Cardiac rehab regarding the medication change listed on the patient's chart for spironolactone. She reports the patient is unaware of it and is requesting he be called regarding it. Please advise.

## 2023-02-25 NOTE — Telephone Encounter (Signed)
Left voicemail to return call to [facility]

## 2023-02-26 ENCOUNTER — Telehealth: Payer: Self-pay | Admitting: *Deleted

## 2023-02-26 MED ORDER — SACUBITRIL-VALSARTAN 24-26 MG PO TABS: 1.0000 | ORAL_TABLET | Freq: Two times a day (BID) | ORAL | 3 refills | Status: DC

## 2023-02-26 NOTE — Telephone Encounter (Signed)
Spoke with pt, aware of dr hochrein's recommendations. He will cut the current supply of entresto in 1/2. New script sent to the pharmacy.

## 2023-02-26 NOTE — Telephone Encounter (Signed)
-----   Message from Rollene Rotunda, MD sent at 02/21/2023  7:16 AM EDT ----- Can we reduce his Entresto to 24/26 bid.  Thanks.  ----- Message ----- From: Cammy Copa, RN Sent: 02/19/2023   4:10 PM EDT To: Rollene Rotunda, MD; Bernita Buffy, RN  Good afternoon Dr Antoine Poche, Mr Greiwe reports feeling lightheaded when getting up in the morning and when he stands after sitting for a long time.  Sitting blood pressure 94/60 Standing BP 104/66. Please see attached vital signs from today. Is there any possibility you could review and consider adjusting his medications?  Thanks for your assistance and input! Sincerely, [facility]  Cardiac Rehab

## 2023-02-27 ENCOUNTER — Encounter (HOSPITAL_COMMUNITY)
Admission: RE | Admit: 2023-02-27 | Discharge: 2023-02-27 | Disposition: A | Discharge status: Home / Self Care | Payer: PPO | Source: Ambulatory Visit | Attending: Cardiology | Admitting: Cardiology

## 2023-02-27 DIAGNOSIS — I5022 Chronic systolic (congestive) heart failure: Secondary | ICD-10-CM

## 2023-02-27 DIAGNOSIS — I2089 Other forms of angina pectoris: Secondary | ICD-10-CM | POA: Diagnosis not present

## 2023-02-28 LAB — GLUCOSE, CAPILLARY
Glucose-Capillary: 175 mg/dL — ABNORMAL HIGH (ref 70–99)
Glucose-Capillary: 137 mg/dL — ABNORMAL HIGH (ref 70–99)

## 2023-02-28 NOTE — Progress Notes (Signed)
Cardiac Individual Treatment Plan  Patient Details  Name: Benjamin Erickson MRN: [id number] Date of Birth: [birth year] Referring Provider:   Flowsheet Row INTENSIVE CARDIAC REHAB ORIENT from 02/19/2023 in [facility]  Referring Provider Rollene Rotunda, MD       Initial Encounter Date:  Flowsheet Row INTENSIVE CARDIAC REHAB ORIENT from 02/19/2023 in [facility], [facility]ascular, [facility] Lung [facility]  Date 02/19/23       Visit Diagnosis: Chronic stable angina  Chronic systolic heart failure  Patient's Home Medications on Admission:  Current Outpatient Medications:    acetaminophen (TYLENOL) 325 MG tablet, Take 650 mg by mouth as needed for moderate pain., Disp: , Rfl:    Alirocumab (PRALUENT) 150 MG/ML SOAJ, INJECT 150 MG INTO THE SKIN EVERY 14 (FOURTEEN) DAYS., Disp: 6 mL, Rfl: 3   aspirin EC 81 MG tablet, Take 81 mg by mouth daily. Swallow whole., Disp: , Rfl:    calcium elemental as carbonate (TUMS ULTRA 1000) 400 MG chewable tablet, Chew 2,000-3,000 mg by mouth as needed for heartburn., Disp: , Rfl:    carvedilol (COREG) 6.25 MG tablet, TAKE 1 TABLET BY MOUTH TWICE A DAY (Patient taking differently: Take 6.25 mg by mouth 2 (two) times daily with a meal.), Disp: 180 tablet, Rfl: 3   dapagliflozin propanediol (FARXIGA) 10 MG TABS tablet, Take 1 tablet (10 mg total) by mouth daily before breakfast., Disp: 90 tablet, Rfl: 3   furosemide (LASIX) 20 MG tablet, Take 1 tablet (20 mg total) by mouth daily. May take extra dose of lasix if has worsening dyspnea or if your weight increase by more than 3 lbs overnight or 5 lbs in a single week, Disp: 45 tablet, Rfl: 3   metFORMIN (GLUCOPHAGE) 500 MG tablet, Take 1 tablet (500 mg total) by mouth 2 (two) times daily., Disp: , Rfl:    nitroGLYCERIN (NITROSTAT) 0.4 MG SL tablet, Place 1 tablet (0.4 mg total) under the tongue every 5 (five) minutes as needed for chest pain.,  Disp: 90 tablet, Rfl: 3   rosuvastatin (CRESTOR) 20 MG tablet, Take 1 tablet (20 mg total) by mouth at bedtime., Disp: 90 tablet, Rfl: 1   sacubitril-valsartan (ENTRESTO) 24-26 MG, Take 1 tablet by mouth 2 (two) times daily., Disp: 180 tablet, Rfl: 3   spironolactone (ALDACTONE) 25 MG tablet, Take 0.5 tablets (12.5 mg total) by mouth every other day., Disp: 30 tablet, Rfl: 2   tamsulosin (FLOMAX) 0.4 MG CAPS capsule, Take 0.4 mg by mouth daily., Disp: , Rfl:   Past Medical History: Past Medical History:  Diagnosis Date   Bigeminy    DM (diabetes mellitus)    Frequent PVCs    Hyperlipidemia    Hypertension    Nocturia     Tobacco Use: Social History   Tobacco Use  Smoking Status Former   Types: Cigarettes  Smokeless Tobacco Never  Tobacco Comments   Quit 40 years ago    Labs: Review Flowsheet  More data exists      Latest Ref Rng & Units 10/07/2020 01/31/2021 07/18/2022 01/27/2023 01/28/2023  Labs for ITP Cardiac and Pulmonary Rehab  Cholestrol 0 - 200 mg/dL 409  811  914  782  -  LDL (calc) 0 - 99 mg/dL 95  59  46  57  -  HDL-C >40 mg/dL 45  40  42  32  -  Trlycerides <150 mg/dL 956  213  086  578  -  Hemoglobin A1c 4.8 - 5.6 % - - - - 6.7     Capillary Blood Glucose: Lab Results  Component Value Date   GLUCAP 137 (H) 02/27/2023   GLUCAP 175 (H) 02/27/2023   GLUCAP 115 (H) 02/25/2023   GLUCAP 147 (H) 02/25/2023   GLUCAP 103 (H) 02/19/2023     Exercise Target Goals: Exercise Program Goal: Individual exercise prescription set using results from initial 6 min walk test and THRR while considering  patient's activity barriers and safety.   Exercise Prescription Goal: Initial exercise prescription builds to 30-45 minutes a day of aerobic activity, 2-3 days per week.  Home exercise guidelines will be given to patient during program as part of exercise prescription that the participant will acknowledge.  Activity Barriers & Risk Stratification:  Activity Barriers &  Cardiac Risk Stratification - 02/19/23 1514       Activity Barriers & Cardiac Risk Stratification   Activity Barriers Joint Problems;Deconditioning;Muscular Weakness;Balance Concerns;Decreased Ventricular Function;Shortness of Breath    Cardiac Risk Stratification High             6 Minute Walk:  6 Minute Walk     Row Name 02/19/23 1144         6 Minute Walk   Phase Initial     Distance 977 feet     Walk Time 6 minutes     # of Rest Breaks 0     MPH 1.9     METS 1.62     RPE 11     Perceived Dyspnea  1     VO2 Peak 5.7     Symptoms Yes (comment)     Comments SOB, RPD = 1, Bilateral leg pain 2/10     Resting HR 84 bpm     Resting BP 104/74     Resting Oxygen Saturation  91 %     Exercise Oxygen Saturation  during 6 min walk 97 %     Max Ex. HR 91 bpm     Max Ex. BP 110/80     2 Minute Post BP 106/68              Oxygen Initial Assessment:   Oxygen Re-Evaluation:   Oxygen Discharge (Final Oxygen Re-Evaluation):   Initial Exercise Prescription:  Initial Exercise Prescription - 02/19/23 1500       Date of Initial Exercise RX and Referring Provider   Date 02/19/23    Referring Provider Rollene Rotunda, MD    Expected Discharge Date 05/03/23      NuStep   Level 1    SPM 75    Minutes 20    METs 1.6      Prescription Details   Frequency (times per week) 3    Duration Progress to 30 minutes of continuous aerobic without signs/symptoms of physical distress      Intensity   THRR 40-80% of Max Heartrate 60-119    Ratings of Perceived Exertion 11-13    Perceived Dyspnea 0-4      Progression   Progression Continue progressive overload as per policy without signs/symptoms or physical distress.      Resistance Training   Training Prescription Yes    Weight 2 lbs    Reps 10-15             Perform Capillary Blood Glucose checks as needed.  Exercise Prescription Changes:   Exercise Prescription Changes     Row Name 02/25/23 1400  Response to Exercise   Blood Pressure (Admit) 108/70       Blood Pressure (Exercise) 118/70       Blood Pressure (Exit) 110/70       Heart Rate (Admit) 69 bpm       Heart Rate (Exercise) 89 bpm       Heart Rate (Exit) 78 bpm       Rating of Perceived Exertion (Exercise) 10       Symptoms SOB, lightheadedness       Comments Pt's first day in the CRP2 program       Duration Progress to 30 minutes of  aerobic without signs/symptoms of physical distress       Intensity THRR unchanged         Progression   Progression Continue to progress workloads to maintain intensity without signs/symptoms of physical distress.       Average METs 1.7         Resistance Training   Training Prescription Yes       Weight 2 lbs       Reps 10-15       Time 10 Minutes         Interval Training   Interval Training No         NuStep   Level 1       Minutes 20       METs 1.7                Exercise Comments:   Exercise Comments     Row Name 02/25/23 1423           Exercise Comments Pt's first day in the CRP2 program. Pt did 20 minutes on Nustep with several rest breaks. Pt did have c/o SOB with SaO2 of 96%, RPD = 2.                Exercise Goals and Review:   Exercise Goals     Row Name 02/19/23 1521             Exercise Goals   Increase Physical Activity Yes       Intervention Provide advice, education, support and counseling about physical activity/exercise needs.;Develop an individualized exercise prescription for aerobic and resistive training based on initial evaluation findings, risk stratification, comorbidities and participant's personal goals.       Expected Outcomes Short Term: Attend rehab on a regular basis to increase amount of physical activity.;Long Term: Add in home exercise to make exercise part of routine and to increase amount of physical activity.;Long Term: Exercising regularly at least 3-5 days a week.       Increase Strength and Stamina Yes        Intervention Provide advice, education, support and counseling about physical activity/exercise needs.;Develop an individualized exercise prescription for aerobic and resistive training based on initial evaluation findings, risk stratification, comorbidities and participant's personal goals.       Expected Outcomes Short Term: Increase workloads from initial exercise prescription for resistance, speed, and METs.;Short Term: Perform resistance training exercises routinely during rehab and add in resistance training at home;Long Term: Improve cardiorespiratory fitness, muscular endurance and strength as measured by increased METs and functional capacity ( )       Able to understand and use rate of perceived exertion (RPE) scale Yes       Intervention Provide education and explanation on how to use RPE scale       Expected Outcomes Short Term: Able  to use RPE daily in rehab to express subjective intensity level;Long Term:  Able to use RPE to guide intensity level when exercising independently       Knowledge and understanding of Target Heart Rate Range (THRR) Yes       Intervention Provide education and explanation of THRR including how the numbers were predicted and where they are located for reference       Expected Outcomes Short Term: Able to state/look up THRR;Short Term: Able to use daily as guideline for intensity in rehab;Long Term: Able to use THRR to govern intensity when exercising independently       Understanding of Exercise Prescription Yes       Intervention Provide education, explanation, and written materials on patient's individual exercise prescription       Expected Outcomes Short Term: Able to explain program exercise prescription;Long Term: Able to explain home exercise prescription to exercise independently                Exercise Goals Re-Evaluation :  Exercise Goals Re-Evaluation     Row Name 02/25/23 1421             Exercise Goal Re-Evaluation   Exercise  Goals Review Increase Physical Activity;Understanding of Exercise Prescription;Increase Strength and Stamina;Knowledge and understanding of Target Heart Rate Range (THRR);Able to understand and use rate of perceived exertion (RPE) scale       Comments Pt's first day in the CRP2 program. Pt understands the exercise Rx, RPE scale, and THRR.       Expected Outcomes Will continue to monitor patient and progress exercise workloads as tolerated.                Discharge Exercise Prescription (Final Exercise Prescription Changes):  Exercise Prescription Changes - 02/25/23 1400       Response to Exercise   Blood Pressure (Admit) 108/70    Blood Pressure (Exercise) 118/70    Blood Pressure (Exit) 110/70    Heart Rate (Admit) 69 bpm    Heart Rate (Exercise) 89 bpm    Heart Rate (Exit) 78 bpm    Rating of Perceived Exertion (Exercise) 10    Symptoms SOB, lightheadedness    Comments Pt's first day in the CRP2 program    Duration Progress to 30 minutes of  aerobic without signs/symptoms of physical distress    Intensity THRR unchanged      Progression   Progression Continue to progress workloads to maintain intensity without signs/symptoms of physical distress.    Average METs 1.7      Resistance Training   Training Prescription Yes    Weight 2 lbs    Reps 10-15    Time 10 Minutes      Interval Training   Interval Training No      NuStep   Level 1    Minutes 20    METs 1.7             Nutrition:  Target Goals: Understanding of nutrition guidelines, daily intake of sodium 1500mg , cholesterol 200mg , calories 30% from fat and 7% or less from saturated fats, daily to have 5 or more servings of fruits and vegetables.  Biometrics:  Pre Biometrics - 02/19/23 1330       Pre Biometrics   Waist Circumference 48.75 inches    Hip Circumference 45.5 inches    Waist to Hip Ratio 1.07 %    Triceps Skinfold 16 mm    % Body Fat 34.9 %  Grip Strength 35 kg    Flexibility 0 in    cannot reach   Single Leg Stand 3.5 seconds              Nutrition Therapy Plan and Nutrition Goals:  Nutrition Therapy & Goals - 02/25/23 1504       Nutrition Therapy   Diet Heart Healthy/Carbohydrate Consistent Diet    Drug/Food Interactions Statins/Certain Fruits      Personal Nutrition Goals   Nutrition Goal Patient to identify strategies for reducing cardiovascular risk by attending the Pritikin education and nutrition series weekly.    Personal Goal #2 Patient to improve diet quality by using the plate method as a guide for meal planning to include lean protein/plant protein, fruits, vegetables, whole grains, nonfat dairy as part of a well-balanced diet.    Personal Goal #3 Patient to reduce sodium to 1500mg  per day    Comments Avenir will benefit from participation in intensive cardiac rehab for nutrition, exercise, and lifestyle modification.      Intervention Plan   Intervention Prescribe, educate and counsel regarding individualized specific dietary modifications aiming towards targeted core components such as weight, hypertension, lipid management, diabetes, heart failure and other comorbidities.;Nutrition handout(s) given to patient.    Expected Outcomes Short Term Goal: Understand basic principles of dietary content, such as calories, fat, sodium, cholesterol and nutrients.;Long Term Goal: Adherence to prescribed nutrition plan.             Nutrition Assessments:  Nutrition Assessments - 02/26/23 1526       Rate Your Plate Scores   Pre Score 57            MEDIFICTS Score Key: ?70 Need to make dietary changes  40-70 Heart Healthy Diet ? 40 Therapeutic Level Cholesterol Diet   Flowsheet Row INTENSIVE CARDIAC REHAB from 02/25/2023 in [facility] for Heart, Vascular, & Lung Health  Picture Your Plate Total Score on Admission 57      Picture Your Plate Scores: <57 Unhealthy dietary pattern with much room for improvement. 41-50  Dietary pattern unlikely to meet recommendations for good health and room for improvement. 51-60 More healthful dietary pattern, with some room for improvement.  >60 Healthy dietary pattern, although there may be some specific behaviors that could be improved.    Nutrition Goals Re-Evaluation:  Nutrition Goals Re-Evaluation     Row Name 02/25/23 1504             Goals   Current Weight 226 lb 6.6 oz (102.7 kg)       Comment A1c 6.7, Potassium 5.5, triglycerides 272, HDL 32, VLDL 54       Expected Outcome will benefit from participation in intensive cardiac rehab for nutrition, exercise, and lifestyle modification.                Nutrition Goals Re-Evaluation:  Nutrition Goals Re-Evaluation     Row Name 02/25/23 1504             Goals   Current Weight 226 lb 6.6 oz (102.7 kg)       Comment A1c 6.7, Potassium 5.5, triglycerides 272, HDL 32, VLDL 54       Expected Outcome will benefit from participation in intensive cardiac rehab for nutrition, exercise, and lifestyle modification.                Nutrition Goals Discharge (Final Nutrition Goals Re-Evaluation):  Nutrition Goals Re-Evaluation - 02/25/23 1504  Goals   Current Weight 226 lb 6.6 oz (102.7 kg)    Comment A1c 6.7, Potassium 5.5, triglycerides 272, HDL 32, VLDL 54    Expected Outcome will benefit from participation in intensive cardiac rehab for nutrition, exercise, and lifestyle modification.             Psychosocial: Target Goals: Acknowledge presence or absence of significant depression and/or stress, maximize coping skills, provide positive support system. Participant is able to verbalize types and ability to use techniques and skills needed for reducing stress and depression.  Initial Review & Psychosocial Screening:  Initial Psych Review & Screening - 02/19/23 1525       Initial Review   Current issues with None Identified      Family Dynamics   Good Support System? Yes     Comments Has spouse for support      Barriers   Psychosocial barriers to participate in program There are no identifiable barriers or psychosocial needs.      Screening Interventions   Interventions Encouraged to exercise             Quality of Life Scores:  Quality of Life - 02/19/23 1524       Quality of Life   Select Quality of Life      Quality of Life Scores   Health/Function Pre 21.93 %    Socioeconomic Pre 29.17 %    Psych/Spiritual Pre 28.29 %    Family Pre 30 %    GLOBAL Pre 25.82 %            Scores of 19 and below usually indicate a poorer quality of life in these areas.  A difference of  2-3 points is a clinically meaningful difference.  A difference of 2-3 points in the total score of the Quality of Life Index has been associated with significant improvement in overall quality of life, self-image, physical symptoms, and general health in studies assessing change in quality of life.  PHQ-9: Review Flowsheet       02/19/2023 07/05/2016  Depression screen PHQ 2/9  Decreased Interest 0 0  Down, Depressed, Hopeless 0 0  PHQ - 2 Score 0 0  Altered sleeping 2 -  Tired, decreased energy 3 -  Change in appetite 0 -  Feeling bad or failure about yourself  0 -  Trouble concentrating 0 -  Moving slowly or fidgety/restless 0 -  Suicidal thoughts 0 -  PHQ-9 Score 5 -  Difficult doing work/chores Not difficult at all -   Interpretation of Total Score  Total Score Depression Severity:  1-4 = Minimal depression, 5-9 = Mild depression, 10-14 = Moderate depression, 15-19 = Moderately severe depression, 20-27 = Severe depression   Psychosocial Evaluation and Intervention:   Psychosocial Re-Evaluation:  Psychosocial Re-Evaluation     Row Name 02/26/23 0743             Psychosocial Re-Evaluation   Current issues with None Identified       Interventions Encouraged to attend Cardiac Rehabilitation for the exercise       Continue Psychosocial Services  No  Follow up required                Psychosocial Discharge (Final Psychosocial Re-Evaluation):  Psychosocial Re-Evaluation - 02/26/23 0743       Psychosocial Re-Evaluation   Current issues with None Identified    Interventions Encouraged to attend Cardiac Rehabilitation for the exercise    Continue Psychosocial Services  No  Follow up required             Vocational Rehabilitation: Provide vocational rehab assistance to qualifying candidates.   Vocational Rehab Evaluation & Intervention:  Vocational Rehab - 02/19/23 1526       Initial Vocational Rehab Evaluation & Intervention   Assessment shows need for Vocational Rehabilitation No   Pt is retired            Education: Education Goals: Education classes will be provided on a weekly basis, covering required topics. Participant will state understanding/return demonstration of topics presented.    Education     Row Name 02/25/23 1500     Education   Cardiac Education Topics Pritikin   Psychologist, counselling   Select Nutrition   Nutrition Nutrition Action Plan   Instruction Review Code 1- Verbalizes Understanding   Class Start Time 1405   Class Stop Time 1440   Class Time Calculation (min) 35 min    Row Name 02/27/23 1600     Education   Cardiac Education Topics Pritikin   Customer service manager   Weekly Topic Efficiency Cooking - Meals in a Snap   Instruction Review Code 1- Verbalizes Understanding   Class Start Time 1400   Class Stop Time 1445   Class Time Calculation (min) 45 min            Core Videos: Exercise    Move It!  Clinical staff conducted group or individual video education with verbal and written material and guidebook.  Patient learns the recommended Pritikin exercise program. Exercise with the goal of living a long, healthy life. Some of the health benefits of exercise include controlled diabetes,  healthier blood pressure levels, improved cholesterol levels, improved heart and lung capacity, improved sleep, and better body composition. Everyone should speak with their doctor before starting or changing an exercise routine.  Biomechanical Limitations Clinical staff conducted group or individual video education with verbal and written material and guidebook.  Patient learns how biomechanical limitations can impact exercise and how we can mitigate and possibly overcome limitations to have an impactful and balanced exercise routine.  Body Composition Clinical staff conducted group or individual video education with verbal and written material and guidebook.  Patient learns that body composition (ratio of muscle mass to fat mass) is a key component to assessing overall fitness, rather than body weight alone. Increased fat mass, especially visceral belly fat, can put [place] at increased risk for metabolic syndrome, type 2 diabetes, heart disease, and even death. It is recommended to combine diet and exercise (cardiovascular and resistance training) to improve your body composition. Seek guidance from your physician and exercise physiologist before implementing an exercise routine.  Exercise Action Plan Clinical staff conducted group or individual video education with verbal and written material and guidebook.  Patient learns the recommended strategies to achieve and enjoy long-term exercise adherence, including variety, self-motivation, self-efficacy, and positive decision making. Benefits of exercise include fitness, good health, weight management, more energy, better sleep, less stress, and overall well-being.  Medical   Heart Disease Risk Reduction Clinical staff conducted group or individual video education with verbal and written material and guidebook.  Patient learns our heart is our most vital organ as it circulates oxygen, nutrients, white blood cells, and hormones throughout the entire body,  and carries waste away. Data supports a plant-based eating plan like the  Pritikin Program for its effectiveness in slowing progression of and reversing heart disease. The video provides a number of recommendations to address heart disease.   Metabolic Syndrome and Belly Fat  Clinical staff conducted group or individual video education with verbal and written material and guidebook.  Patient learns what metabolic syndrome is, how it leads to heart disease, and how one can reverse it and keep it from coming back. You have metabolic syndrome if you have 3 of the following 5 criteria: abdominal obesity, high blood pressure, high triglycerides, low HDL cholesterol, and high blood sugar.  Hypertension and Heart Disease Clinical staff conducted group or individual video education with verbal and written material and guidebook.  Patient learns that high blood pressure, or hypertension, is very common in the [place]. Hypertension is largely due to excessive salt intake, but other important risk factors include being overweight, physical inactivity, drinking too much alcohol, smoking, and not eating enough potassium from fruits and vegetables. High blood pressure is a leading risk factor for heart attack, stroke, congestive heart failure, dementia, kidney failure, and premature death. Long-term effects of excessive salt intake include stiffening of the arteries and thickening of heart muscle and organ damage. Recommendations include ways to reduce hypertension and the risk of heart disease.  Diseases of Our Time - Focusing on Diabetes Clinical staff conducted group or individual video education with verbal and written material and guidebook.  Patient learns why the best way to stop diseases of our time is prevention, through food and other lifestyle changes. Medicine (such as prescription pills and surgeries) is often only a Band-Aid on the problem, not a long-term solution. Most common diseases of our time  include obesity, type 2 diabetes, hypertension, heart disease, and cancer. The Pritikin Program is recommended and has been proven to help reduce, reverse, and/or prevent the damaging effects of metabolic syndrome.  Nutrition   Overview of the Pritikin Eating Plan  Clinical staff conducted group or individual video education with verbal and written material and guidebook.  Patient learns about the Pritikin Eating Plan for disease risk reduction. The Pritikin Eating Plan emphasizes a wide variety of unrefined, minimally-processed carbohydrates, like fruits, vegetables, whole grains, and legumes. Go, Caution, and Stop food choices are explained. Plant-based and lean animal proteins are emphasized. Rationale provided for low sodium intake for blood pressure control, low added sugars for blood sugar stabilization, and low added fats and oils for coronary artery disease risk reduction and weight management.  Calorie Density  Clinical staff conducted group or individual video education with verbal and written material and guidebook.  Patient learns about calorie density and how it impacts the Pritikin Eating Plan. Knowing the characteristics of the food you choose will help you decide whether those foods will lead to weight gain or weight loss, and whether you want to consume more or less of them. Weight loss is usually a side effect of the Pritikin Eating Plan because of its focus on low calorie-dense foods.  Label Reading  Clinical staff conducted group or individual video education with verbal and written material and guidebook.  Patient learns about the Pritikin recommended label reading guidelines and corresponding recommendations regarding calorie density, added sugars, sodium content, and whole grains.  Dining Out - Part 1  Clinical staff conducted group or individual video education with verbal and written material and guidebook.  Patient learns that restaurant meals can be sabotaging because they  can be so high in calories, fat, sodium, and/or sugar. Patient learns  recommended strategies on how to positively address this and avoid unhealthy pitfalls.  Facts on Fats  Clinical staff conducted group or individual video education with verbal and written material and guidebook.  Patient learns that lifestyle modifications can be just as effective, if not more so, as many medications for lowering your risk of heart disease. A Pritikin lifestyle can help to reduce your risk of inflammation and atherosclerosis (cholesterol build-up, or plaque, in the artery walls). Lifestyle interventions such as dietary choices and physical activity address the cause of atherosclerosis. A review of the types of fats and their impact on blood cholesterol levels, along with dietary recommendations to reduce fat intake is also included.  Nutrition Action Plan  Clinical staff conducted group or individual video education with verbal and written material and guidebook.  Patient learns how to incorporate Pritikin recommendations into their lifestyle. Recommendations include planning and keeping personal health goals in mind as an important part of their success.  Healthy Mind-Set    Healthy Minds, Bodies, Hearts  Clinical staff conducted group or individual video education with verbal and written material and guidebook.  Patient learns how to identify when they are stressed. Video will discuss the impact of that stress, as well as the many benefits of stress management. Patient will also be introduced to stress management techniques. The way we think, act, and feel has an impact on our hearts.  How Our Thoughts Can Heal Our Hearts  Clinical staff conducted group or individual video education with verbal and written material and guidebook.  Patient learns that negative thoughts can cause depression and anxiety. This can result in negative lifestyle behavior and serious health problems. Cognitive behavioral therapy is an  effective method to help control our thoughts in order to change and improve our emotional outlook.  Additional Videos:  Exercise    Improving Performance  Clinical staff conducted group or individual video education with verbal and written material and guidebook.  Patient learns to use a non-linear approach by alternating intensity levels and lengths of time spent exercising to help burn more calories and lose more body fat. Cardiovascular exercise helps improve heart health, metabolism, hormonal balance, blood sugar control, and recovery from fatigue. Resistance training improves strength, endurance, balance, coordination, reaction time, metabolism, and muscle mass. Flexibility exercise improves circulation, posture, and balance. Seek guidance from your physician and exercise physiologist before implementing an exercise routine and learn your capabilities and proper form for all exercise.  Introduction to Yoga  Clinical staff conducted group or individual video education with verbal and written material and guidebook.  Patient learns about yoga, a discipline of the coming together of mind, breath, and body. The benefits of yoga include improved flexibility, improved range of motion, better posture and core strength, increased lung function, weight loss, and positive self-image. Yoga's heart health benefits include lowered blood pressure, healthier heart rate, decreased cholesterol and triglyceride levels, improved immune function, and reduced stress. Seek guidance from your physician and exercise physiologist before implementing an exercise routine and learn your capabilities and proper form for all exercise.  Medical   Aging: Enhancing Your Quality of Life  Clinical staff conducted group or individual video education with verbal and written material and guidebook.  Patient learns key strategies and recommendations to stay in good physical health and enhance quality of life, such as prevention  strategies, having an advocate, securing a Health Care Proxy and Power of Attorney, and keeping a list of medications and system for tracking them. It also  discusses how to avoid risk for bone loss.  Biology of Weight Control  Clinical staff conducted group or individual video education with verbal and written material and guidebook.  Patient learns that weight gain occurs because we consume more calories than we burn (eating more, moving less). Even if your body weight is normal, you may have higher ratios of fat compared to muscle mass. Too much body fat puts you at increased risk for cardiovascular disease, heart attack, stroke, type 2 diabetes, and obesity-related cancers. In addition to exercise, following the Pritikin Eating Plan can help reduce your risk.  Decoding Lab Results  Clinical staff conducted group or individual video education with verbal and written material and guidebook.  Patient learns that lab test reflects one measurement whose values change over time and are influenced by many factors, including medication, stress, sleep, exercise, food, hydration, pre-existing medical conditions, and more. It is recommended to use the knowledge from this video to become more involved with your lab results and evaluate your numbers to speak with your doctor.   Diseases of Our Time - Overview  Clinical staff conducted group or individual video education with verbal and written material and guidebook.  Patient learns that according to the CDC, 50% to 70% of chronic diseases (such as obesity, type 2 diabetes, elevated lipids, hypertension, and heart disease) are avoidable through lifestyle improvements including healthier food choices, listening to satiety cues, and increased physical activity.  Sleep Disorders Clinical staff conducted group or individual video education with verbal and written material and guidebook.  Patient learns how good quality and duration of sleep are important to  overall health and well-being. Patient also learns about sleep disorders and how they impact health along with recommendations to address them, including discussing with a physician.  Nutrition  Dining Out - Part 2 Clinical staff conducted group or individual video education with verbal and written material and guidebook.  Patient learns how to plan ahead and communicate in order to maximize their dining experience in a healthy and nutritious manner. Included are recommended food choices based on the type of restaurant the patient is visiting.   Fueling a Banker conducted group or individual video education with verbal and written material and guidebook.  There is a strong connection between our food choices and our health. Diseases like obesity and type 2 diabetes are very prevalent and are in large-part due to lifestyle choices. The Pritikin Eating Plan provides plenty of food and hunger-curbing satisfaction. It is easy to follow, affordable, and helps reduce health risks.  Menu Workshop  Clinical staff conducted group or individual video education with verbal and written material and guidebook.  Patient learns that restaurant meals can sabotage health goals because they are often packed with calories, fat, sodium, and sugar. Recommendations include strategies to plan ahead and to communicate with the manager, chef, or server to help order a healthier meal.  Planning Your Eating Strategy  Clinical staff conducted group or individual video education with verbal and written material and guidebook.  Patient learns about the Pritikin Eating Plan and its benefit of reducing the risk of disease. The Pritikin Eating Plan does not focus on calories. Instead, it emphasizes high-quality, nutrient-rich foods. By knowing the characteristics of the foods, we choose, we can determine their calorie density and make informed decisions.  Targeting Your Nutrition Priorities  Clinical staff  conducted group or individual video education with verbal and written material and guidebook.  Patient learns that lifestyle  habits have a tremendous impact on disease risk and progression. This video provides eating and physical activity recommendations based on your personal health goals, such as reducing LDL cholesterol, losing weight, preventing or controlling type 2 diabetes, and reducing high blood pressure.  Vitamins and Minerals  Clinical staff conducted group or individual video education with verbal and written material and guidebook.  Patient learns different ways to obtain key vitamins and minerals, including through a recommended healthy diet. It is important to discuss all supplements you take with your doctor.   Healthy Mind-Set    Smoking Cessation  Clinical staff conducted group or individual video education with verbal and written material and guidebook.  Patient learns that cigarette smoking and tobacco addiction pose a serious health risk which affects millions of people. Stopping smoking will significantly reduce the risk of heart disease, lung disease, and many forms of cancer. Recommended strategies for quitting are covered, including working with your doctor to develop a successful plan.  Culinary   Becoming a Set designer conducted group or individual video education with verbal and written material and guidebook.  Patient learns that cooking at home can be healthy, cost-effective, quick, and puts them in control. Keys to cooking healthy recipes will include looking at your recipe, assessing your equipment needs, planning ahead, making it simple, choosing cost-effective seasonal ingredients, and limiting the use of added fats, salts, and sugars.  Cooking - Breakfast and Snacks  Clinical staff conducted group or individual video education with verbal and written material and guidebook.  Patient learns how important breakfast is to satiety and nutrition  through the entire day. Recommendations include key foods to eat during breakfast to help stabilize blood sugar levels and to prevent overeating at meals later in the day. Planning ahead is also a key component.  Cooking - Educational psychologist conducted group or individual video education with verbal and written material and guidebook.  Patient learns eating strategies to improve overall health, including an approach to cook more at home. Recommendations include thinking of animal protein as a side on your plate rather than center stage and focusing instead on lower calorie dense options like vegetables, fruits, whole grains, and plant-based proteins, such as beans. Making sauces in large quantities to freeze for later and leaving the skin on your vegetables are also recommended to maximize your experience.  Cooking - Healthy Salads and Dressing Clinical staff conducted group or individual video education with verbal and written material and guidebook.  Patient learns that vegetables, fruits, whole grains, and legumes are the foundations of the Pritikin Eating Plan. Recommendations include how to incorporate each of these in flavorful and healthy salads, and how to create homemade salad dressings. Proper handling of ingredients is also covered. Cooking - Soups and [employer] - Soups and Desserts Clinical staff conducted group or individual video education with verbal and written material and guidebook.  Patient learns that Pritikin soups and desserts make for easy, nutritious, and delicious snacks and meal components that are low in sodium, fat, sugar, and calorie density, while high in vitamins, minerals, and filling fiber. Recommendations include simple and healthy ideas for soups and desserts.   Overview     The Pritikin Solution Program Overview Clinical staff conducted group or individual video education with verbal and written material and guidebook.  Patient learns that the  results of the Pritikin Program have been documented in more than 100 articles published in peer-reviewed journals, and the  benefits include reducing risk factors for (and, in some cases, even reversing) high cholesterol, high blood pressure, type 2 diabetes, obesity, and more! An overview of the three key pillars of the Pritikin Program will be covered: eating well, doing regular exercise, and having a healthy mind-set.  WORKSHOPS  Exercise: Exercise Basics: Building Your Action Plan Clinical staff led group instruction and group discussion with PowerPoint presentation and patient guidebook. To enhance the learning environment the use of posters, models and videos may be added. At the conclusion of this workshop, patients will comprehend the difference between physical activity and exercise, as well as the benefits of incorporating both, into their routine. Patients will understand the FITT (Frequency, Intensity, Time, and Type) principle and how to use it to build an exercise action plan. In addition, safety concerns and other considerations for exercise and cardiac rehab will be addressed by the presenter. The purpose of this lesson is to promote a comprehensive and effective weekly exercise routine in order to improve patients' overall level of fitness.   Managing Heart Disease: Your Path to a Healthier Heart Clinical staff led group instruction and group discussion with PowerPoint presentation and patient guidebook. To enhance the learning environment the use of posters, models and videos may be added.At the conclusion of this workshop, patients will understand the anatomy and physiology of the heart. Additionally, they will understand how Pritikin's three pillars impact the risk factors, the progression, and the management of heart disease.  The purpose of this lesson is to provide a high-level overview of the heart, heart disease, and how the Pritikin lifestyle positively impacts risk  factors.  Exercise Biomechanics Clinical staff led group instruction and group discussion with PowerPoint presentation and patient guidebook. To enhance the learning environment the use of posters, models and videos may be added. Patients will learn how the structural parts of their bodies function and how these functions impact their daily activities, movement, and exercise. Patients will learn how to promote a neutral spine, learn how to manage pain, and identify ways to improve their physical movement in order to promote healthy living. The purpose of this lesson is to expose patients to common physical limitations that impact physical activity. Participants will learn practical ways to adapt and manage aches and pains, and to minimize their effect on regular exercise. Patients will learn how to maintain good posture while sitting, walking, and lifting.  Balance Training and Fall Prevention  Clinical staff led group instruction and group discussion with PowerPoint presentation and patient guidebook. To enhance the learning environment the use of posters, models and videos may be added. At the conclusion of this workshop, patients will understand the importance of their sensorimotor skills (vision, proprioception, and the vestibular system) in maintaining their ability to balance as they age. Patients will apply a variety of balancing exercises that are appropriate for their current level of function. Patients will understand the common causes for poor balance, possible solutions to these problems, and ways to modify their physical environment in order to minimize their fall risk. The purpose of this lesson is to teach patients about the importance of maintaining balance as they age and ways to minimize their risk of falling.  WORKSHOPS   Nutrition:  Fueling a Ship broker led group instruction and group discussion with PowerPoint presentation and patient guidebook. To enhance  the learning environment the use of posters, models and videos may be added. Patients will review the foundational principles of the Pritikin Eating Plan and  understand what constitutes a serving size in each of the food groups. Patients will also learn Pritikin-friendly foods that are better choices when away from home and review make-ahead meal and snack options. Calorie density will be reviewed and applied to three nutrition priorities: weight maintenance, weight loss, and weight gain. The purpose of this lesson is to reinforce (in a group setting) the key concepts around what patients are recommended to eat and how to apply these guidelines when away from home by planning and selecting Pritikin-friendly options. Patients will understand how calorie density may be adjusted for different weight management goals.  Mindful Eating  Clinical staff led group instruction and group discussion with PowerPoint presentation and patient guidebook. To enhance the learning environment the use of posters, models and videos may be added. Patients will briefly review the concepts of the Pritikin Eating Plan and the importance of low-calorie dense foods. The concept of mindful eating will be introduced as well as the importance of paying attention to internal hunger signals. Triggers for non-hunger eating and techniques for dealing with triggers will be explored. The purpose of this lesson is to provide patients with the opportunity to review the basic principles of the Pritikin Eating Plan, discuss the value of eating mindfully and how to measure internal cues of hunger and fullness using the Hunger Scale. Patients will also discuss reasons for non-hunger eating and learn strategies to use for controlling emotional eating.  Targeting Your Nutrition Priorities Clinical staff led group instruction and group discussion with PowerPoint presentation and patient guidebook. To enhance the learning environment the use of posters,  models and videos may be added. Patients will learn how to determine their genetic susceptibility to disease by reviewing their family history. Patients will gain insight into the importance of diet as part of an overall healthy lifestyle in mitigating the impact of genetics and other environmental insults. The purpose of this lesson is to provide patients with the opportunity to assess their personal nutrition priorities by looking at their family history, their own health history and current risk factors. Patients will also be able to discuss ways of prioritizing and modifying the Pritikin Eating Plan for their highest risk areas  Menu  Clinical staff led group instruction and group discussion with PowerPoint presentation and patient guidebook. To enhance the learning environment the use of posters, models and videos may be added. Using menus brought in from [employer], or printed from [employer], patients will apply the Pritikin dining out guidelines that were presented in the [employer] video. Patients will also be able to practice these guidelines in a variety of provided scenarios. The purpose of this lesson is to provide patients with the opportunity to practice hands-on learning of the Pritikin Dining Out guidelines with actual menus and practice scenarios.  Label Reading Clinical staff led group instruction and group discussion with PowerPoint presentation and patient guidebook. To enhance the learning environment the use of posters, models and videos may be added. Patients will review and discuss the Pritikin label reading guidelines presented in Pritikin's Label Reading Educational series video. Using fool labels brought in from local grocery stores and markets, patients will apply the label reading guidelines and determine if the packaged food meet the Pritikin guidelines. The purpose of this lesson is to provide patients with the opportunity to review, discuss, and  practice hands-on learning of the Pritikin Label Reading guidelines with actual packaged food labels. Cooking School  Pritikin's [employer] are designed to  teach patients ways to prepare quick, simple, and affordable recipes at home. The importance of nutrition's role in chronic disease risk reduction is reflected in its emphasis in the overall Pritikin program. By learning how to prepare essential core Pritikin Eating Plan recipes, patients will increase control over what they eat; be able to customize the flavor of foods without the use of added salt, sugar, or fat; and improve the quality of the food they consume. By learning a set of core recipes which are easily assembled, quickly prepared, and affordable, patients are more likely to prepare more healthy foods at home. These workshops focus on convenient breakfasts, simple entres, side dishes, and desserts which can be prepared with minimal effort and are consistent with nutrition recommendations for cardiovascular risk reduction. Cooking [employer] are taught by a Armed forces logistics/support/administrative officer (RD) who has been trained by the [employer]. The chef or RD has a clear understanding of the importance of minimizing - if not completely eliminating - added fat, sugar, and sodium in recipes. Throughout the series of Cooking School Workshop sessions, patients will learn about healthy ingredients and efficient methods of cooking to build confidence in their capability to prepare    Cooking School weekly topics:  Adding Flavor- Sodium-Free  Fast and Healthy Breakfasts  Powerhouse Plant-Based Proteins  Satisfying Salads and Dressings  Simple Sides and Sauces  International Cuisine-Spotlight on the [employer] Zones  Delicious Desserts  Savory Soups  [employer] - Meals in a Astronomer Appetizers and Snacks  Comforting Weekend Breakfasts  One-Pot Wonders   Fast Evening Meals  Landscape architect Your  Pritikin Plate  WORKSHOPS   Healthy Mindset (Psychosocial):  Focused Goals, Sustainable Changes Clinical staff led group instruction and group discussion with PowerPoint presentation and patient guidebook. To enhance the learning environment the use of posters, models and videos may be added. Patients will be able to apply effective goal setting strategies to establish at least one personal goal, and then take consistent, meaningful action toward that goal. They will learn to identify common barriers to achieving personal goals and develop strategies to overcome them. Patients will also gain an understanding of how our mind-set can impact our ability to achieve goals and the importance of cultivating a positive and growth-oriented mind-set. The purpose of this lesson is to provide patients with a deeper understanding of how to set and achieve personal goals, as well as the tools and strategies needed to overcome common obstacles which may arise along the way.  From Head to Heart: The Power of a Healthy Outlook  Clinical staff led group instruction and group discussion with PowerPoint presentation and patient guidebook. To enhance the learning environment the use of posters, models and videos may be added. Patients will be able to recognize and describe the impact of emotions and mood on physical health. They will discover the importance of self-care and explore self-care practices which may work for them. Patients will also learn how to utilize the 4 C's to cultivate a healthier outlook and better manage stress and challenges. The purpose of this lesson is to demonstrate to patients how a healthy outlook is an essential part of maintaining good health, especially as they continue their cardiac rehab journey.  Healthy Sleep for a Healthy Heart Clinical staff led group instruction and group discussion with PowerPoint presentation and patient guidebook. To enhance the learning environment the use of  posters, models and videos may be added. At the conclusion of  this workshop, patients will be able to demonstrate knowledge of the importance of sleep to overall health, well-being, and quality of life. They will understand the symptoms of, and treatments for, common sleep disorders. Patients will also be able to identify daytime and nighttime behaviors which impact sleep, and they will be able to apply these tools to help manage sleep-related challenges. The purpose of this lesson is to provide patients with a general overview of sleep and outline the importance of quality sleep. Patients will learn about a few of the most common sleep disorders. Patients will also be introduced to the concept of "sleep hygiene," and discover ways to self-manage certain sleeping problems through simple daily behavior changes. Finally, the workshop will motivate patients by clarifying the links between quality sleep and their goals of heart-healthy living.   Recognizing and Reducing Stress Clinical staff led group instruction and group discussion with PowerPoint presentation and patient guidebook. To enhance the learning environment the use of posters, models and videos may be added. At the conclusion of this workshop, patients will be able to understand the types of stress reactions, differentiate between acute and chronic stress, and recognize the impact that chronic stress has on their health. They will also be able to apply different coping mechanisms, such as reframing negative self-talk. Patients will have the opportunity to practice a variety of stress management techniques, such as deep abdominal breathing, progressive muscle relaxation, and/or guided imagery.  The purpose of this lesson is to educate patients on the role of stress in their lives and to provide healthy techniques for coping with it.  Learning Barriers/Preferences:  Learning Barriers/Preferences - 02/19/23 1528       Learning Barriers/Preferences    Learning Barriers Sight;Hearing   wears glasses; HOH left ear   Learning Preferences Written Material;Computer/Internet;Pictoral;Video             Education Topics:  Knowledge Questionnaire Score:  Knowledge Questionnaire Score - 02/19/23 1527       Knowledge Questionnaire Score   Pre Score 20/24             Core Components/Risk Factors/Patient Goals at Admission:  Personal Goals and Risk Factors at Admission - 02/19/23 1526       Core Components/Risk Factors/Patient Goals on Admission    Weight Management Yes;Obesity;Weight Loss    Intervention Weight Management: Develop a combined nutrition and exercise program designed to reach desired caloric intake, while maintaining appropriate intake of nutrient and fiber, sodium and fats, and appropriate energy expenditure required for the weight goal.;Weight Management: Provide education and appropriate resources to help participant work on and attain dietary goals.;Weight Management/Obesity: Establish reasonable short term and long term weight goals.;Obesity: Provide education and appropriate resources to help participant work on and attain dietary goals.    Admit Weight 227 lb 4.7 oz (103.1 kg)    Goal Weight: Long Term 200 lb (90.7 kg)    Expected Outcomes Short Term: Continue to assess and modify interventions until short term weight is achieved;Long Term: Adherence to nutrition and physical activity/exercise program aimed toward attainment of established weight goal;Weight Loss: Understanding of general recommendations for a balanced deficit meal plan, which promotes 1-2 lb weight loss per week and includes a negative energy balance of [phone] kcal/d;Understanding recommendations for meals to include 15-35% energy as protein, 25-35% energy from fat, 35-60% energy from carbohydrates, less than 200mg  of dietary cholesterol, 20-35 gm of total fiber daily;Understanding of distribution of calorie intake throughout the day with the  consumption  of 4-5 meals/snacks    Diabetes Yes    Intervention Provide education about signs/symptoms and action to take for hypo/hyperglycemia.;Provide education about proper nutrition, including hydration, and aerobic/resistive exercise prescription along with prescribed medications to achieve blood glucose in normal ranges: Fasting glucose 65-99 mg/dL    Expected Outcomes Short Term: Participant verbalizes understanding of the signs/symptoms and immediate care of hyper/hypoglycemia, proper foot care and importance of medication, aerobic/resistive exercise and nutrition plan for blood glucose control.;Long Term: Attainment of HbA1C < 7%.    Heart Failure Yes    Intervention Provide a combined exercise and nutrition program that is supplemented with education, support and counseling about heart failure. Directed toward relieving symptoms such as shortness of breath, decreased exercise tolerance, and extremity edema.    Expected Outcomes Long term: Adoption of self-care skills and reduction of barriers for early signs and symptoms recognition and intervention leading to self-care maintenance.;Short term: Daily weights obtained and reported for increase. Utilizing diuretic protocols set by physician.;Short term: Attendance in program 2-3 days a week with increased exercise capacity. Reported lower sodium intake. Reported increased fruit and vegetable intake. Reports medication compliance.    Hypertension Yes    Intervention Provide education on lifestyle modifcations including regular physical activity/exercise, weight management, moderate sodium restriction and increased consumption of fresh fruit, vegetables, and low fat dairy, alcohol moderation, and smoking cessation.;Monitor prescription use compliance.    Expected Outcomes Short Term: Continued assessment and intervention until BP is < 140/45mm HG in hypertensive participants. < 130/35mm HG in hypertensive participants with diabetes, heart failure or  chronic kidney disease.;Long Term: Maintenance of blood pressure at goal levels.    Lipids Yes    Intervention Provide education and support for participant on nutrition & aerobic/resistive exercise along with prescribed medications to achieve LDL 70mg , HDL >40mg .    Expected Outcomes Short Term: Participant states understanding of desired cholesterol values and is compliant with medications prescribed. Participant is following exercise prescription and nutrition guidelines.;Long Term: Cholesterol controlled with medications as prescribed, with individualized exercise RX and with personalized nutrition plan. Value goals: LDL < 70mg , HDL > 40 mg.             Core Components/Risk Factors/Patient Goals Review:   Goals and Risk Factor Review     Row Name 02/26/23 0744             Core Components/Risk Factors/Patient Goals Review   Personal Goals Review Weight Management/Obesity;Heart Failure;Diabetes;Hypertension;Lipids       Review Chrisangel started intrnsive cardiac rehab on 02/25/23 and did fair with exercise. Johanna reported some shortness of breath and feeling lightheaded. Orthostatic BP's WNL. CBG's WNL       Expected Outcomes Ulys will continue to participate in intensive cardiac rehab for exercise, nutrition and lifestyle modifications.                Core Components/Risk Factors/Patient Goals at Discharge (Final Review):   Goals and Risk Factor Review - 02/26/23 0744       Core Components/Risk Factors/Patient Goals Review   Personal Goals Review Weight Management/Obesity;Heart Failure;Diabetes;Hypertension;Lipids    Review Todd started intrnsive cardiac rehab on 02/25/23 and did fair with exercise. Sigmund reported some shortness of breath and feeling lightheaded. Orthostatic BP's WNL. CBG's WNL    Expected Outcomes Orpheus will continue to participate in intensive cardiac rehab for exercise, nutrition and lifestyle modifications.             ITP Comments:  ITP  Comments  Row Name 02/19/23 1043 02/26/23 0741         ITP Comments Armanda Magicraci Turner, MD: Medical Director.  Intorduction to the [employer] / [facility].  Initial orientation packet reviewed with the patient. 30 Day ITP Review. Zollie BeckersWalter started intensive cardiac rehab on 02/25/23 and did fair with exercise for his fitness level. Zollie BeckersWalter is somewhat decondtioned.               Comments: See ITP comments.

## 2023-03-01 ENCOUNTER — Encounter (HOSPITAL_COMMUNITY)
Admission: RE | Admit: 2023-03-01 | Discharge: 2023-03-01 | Disposition: A | Discharge status: Home / Self Care | Payer: PPO | Source: Ambulatory Visit | Attending: Cardiology | Admitting: Cardiology

## 2023-03-01 DIAGNOSIS — I2089 Other forms of angina pectoris: Secondary | ICD-10-CM | POA: Diagnosis not present

## 2023-03-01 DIAGNOSIS — I5022 Chronic systolic (congestive) heart failure: Secondary | ICD-10-CM

## 2023-03-04 ENCOUNTER — Encounter (HOSPITAL_COMMUNITY)
Admission: RE | Admit: 2023-03-04 | Discharge: 2023-03-04 | Disposition: A | Discharge status: Home / Self Care | Payer: PPO | Source: Ambulatory Visit | Attending: Cardiology | Admitting: Cardiology

## 2023-03-04 DIAGNOSIS — I2089 Other forms of angina pectoris: Secondary | ICD-10-CM | POA: Diagnosis not present

## 2023-03-04 DIAGNOSIS — I5022 Chronic systolic (congestive) heart failure: Secondary | ICD-10-CM

## 2023-03-05 ENCOUNTER — Other Ambulatory Visit: Payer: Self-pay

## 2023-03-05 DIAGNOSIS — Z79899 Other long term (current) drug therapy: Secondary | ICD-10-CM

## 2023-03-06 ENCOUNTER — Encounter: Payer: Self-pay | Admitting: Student

## 2023-03-06 ENCOUNTER — Encounter (HOSPITAL_COMMUNITY): Payer: PPO

## 2023-03-06 ENCOUNTER — Telehealth: Payer: Self-pay | Admitting: Student

## 2023-03-06 ENCOUNTER — Other Ambulatory Visit: Payer: Self-pay

## 2023-03-06 ENCOUNTER — Telehealth: Payer: Self-pay

## 2023-03-06 DIAGNOSIS — Z79899 Other long term (current) drug therapy: Secondary | ICD-10-CM

## 2023-03-06 LAB — BASIC METABOLIC PANEL
BUN/Creatinine Ratio: 23 (ref 10–24)
BUN: 32 mg/dL — ABNORMAL HIGH (ref 8–27)
CO2: 21 mmol/L (ref 20–29)
Calcium: 10.3 mg/dL — ABNORMAL HIGH (ref 8.6–10.2)
Chloride: 96 mmol/L (ref 96–106)
Creatinine, Ser: 1.4 mg/dL — ABNORMAL HIGH (ref 0.76–1.27)
Glucose: 120 mg/dL — ABNORMAL HIGH (ref 70–99)
Potassium: 5.6 mmol/L — ABNORMAL HIGH (ref 3.5–5.2)
Sodium: 137 mmol/L (ref 134–144)
eGFR: 54 mL/min/{1.73_m2} — ABNORMAL LOW (ref >59)

## 2023-03-06 MED ORDER — RANOLAZINE ER 500 MG PO TB12: 500.0000 mg | ORAL_TABLET | Freq: Two times a day (BID) | ORAL | 1 refills | Status: DC

## 2023-03-06 MED ORDER — LOKELMA 10 G PO PACK: 10.0000 g | PACK | Freq: Every day | ORAL | 0 refills | Status: AC

## 2023-03-06 NOTE — Telephone Encounter (Signed)
Patient states  3 episodes of chest pain where he has needed nitro.   Patient had chest pain yesterday.  Started 9 am and he came to get labs done at office and left. He states as the day went it became more severe.  By 1 pm it was noticeable, "not severe" but tightness and pressure.  Slight pain in the Left arm, not to bring to your knees but noticeable. Took 1 nitro and relived the pressure with in about 10 minutes.  The time in between just got progressively better. By 4 pm felt fine.   This also happened end of March, Saturday, had to take one. (First time).  Same symptoms as yesterday but no arm pain. Symptoms alleviated.  April 1st same symptoms of pressure and tightness with long period of SOB.  Took one dose and alleviated symptoms as well  Never radiating pain, nor jaw pain.  3/9 Hospital tightness, chest pain, pressure, these episodes since this hospital stay and his last OV  No weight gain, Today 225, no edema. No chest pain today, feels fine. Wants doctor aware and any recommendations

## 2023-03-06 NOTE — Telephone Encounter (Signed)
See telephone note from DW

## 2023-03-06 NOTE — Telephone Encounter (Signed)
Benjamin Levering, NP called and spoke with the patient in regards to his lab results and what changes she was making to his medication temporarily.   I gave him samples of LOKELMA  3 packets  10g LOT # [id number] EXP: 01/17/2024 The sample medication has been added to his medication list and his medication is at the front of the office for his pickup.

## 2023-03-06 NOTE — Telephone Encounter (Signed)
Spoke with patient regarding latest BMP. He will stop spironolactone. He understands to come to the office to pick up samples of Lokelma. He will take 1 packet x 2 days and return on Monday, 4/22 for repeat BMP. He is concerned, as he notices he does not feel as good since going to every other day dosing of spironolactone.  Based on results of the repeat BMP we will decide how to adjust his Lasix.   Patient also called into triage with complaints of chest pain: "Patient states  3 episodes of chest pain where he has needed nitro.   Patient had chest pain yesterday.  Started 9 am and he came to get labs done at office and left. He states as the day went it became more severe.  By 1 pm it was noticeable, "not severe" but tightness and pressure.  Slight pain in the Left arm, not to bring to your knees but noticeable. Took 1 nitro and relived the pressure with in about 10 minutes.  The time in between just got progressively better. By 4 pm felt fine.    This also happened end of March, Saturday, had to take one. (First time).  Same symptoms as yesterday but no arm pain. Symptoms alleviated.   April 1st same symptoms of pressure and tightness with long period of SOB.  Took one dose and alleviated symptoms as well   Never radiating pain, nor jaw pain.  3/9 Hospital tightness, chest pain, pressure, these episodes since this hospital stay and his last OV  No weight gain, Today 225, no edema. No chest pain today, feels fine. Wants doctor aware and any recommendations"   Patient reports he is pain free today. He cannot tolerate isosorbide secondary to bad dreams when taken at night and not feeling well when taken during the day. Will try Ranexa 500 mg twice a day. Patient is in agreement with plan.

## 2023-03-08 ENCOUNTER — Encounter (HOSPITAL_COMMUNITY)
Admission: RE | Admit: 2023-03-08 | Discharge: 2023-03-08 | Disposition: A | Discharge status: Home / Self Care | Payer: PPO | Source: Ambulatory Visit | Attending: Cardiology | Admitting: Cardiology

## 2023-03-08 DIAGNOSIS — I2089 Other forms of angina pectoris: Secondary | ICD-10-CM | POA: Diagnosis not present

## 2023-03-08 DIAGNOSIS — I5022 Chronic systolic (congestive) heart failure: Secondary | ICD-10-CM

## 2023-03-11 ENCOUNTER — Other Ambulatory Visit: Payer: Self-pay

## 2023-03-11 ENCOUNTER — Encounter (HOSPITAL_COMMUNITY)
Admission: RE | Admit: 2023-03-11 | Discharge: 2023-03-11 | Disposition: A | Discharge status: Home / Self Care | Payer: PPO | Source: Ambulatory Visit | Attending: Cardiology | Admitting: Cardiology

## 2023-03-11 DIAGNOSIS — Z79899 Other long term (current) drug therapy: Secondary | ICD-10-CM | POA: Diagnosis not present

## 2023-03-11 DIAGNOSIS — I2089 Other forms of angina pectoris: Secondary | ICD-10-CM | POA: Diagnosis not present

## 2023-03-11 DIAGNOSIS — I5022 Chronic systolic (congestive) heart failure: Secondary | ICD-10-CM

## 2023-03-12 LAB — BASIC METABOLIC PANEL
BUN/Creatinine Ratio: 19 (ref 10–24)
BUN: 22 mg/dL (ref 8–27)
CO2: 24 mmol/L (ref 20–29)
Calcium: 10 mg/dL (ref 8.6–10.2)
Chloride: 97 mmol/L (ref 96–106)
Creatinine, Ser: 1.16 mg/dL (ref 0.76–1.27)
Glucose: 168 mg/dL — ABNORMAL HIGH (ref 70–99)
Potassium: 5.2 mmol/L (ref 3.5–5.2)
Sodium: 138 mmol/L (ref 134–144)
eGFR: 67 mL/min/{1.73_m2} (ref >59)

## 2023-03-13 ENCOUNTER — Encounter (HOSPITAL_COMMUNITY): Payer: PPO

## 2023-03-15 ENCOUNTER — Encounter (HOSPITAL_COMMUNITY)
Admission: RE | Admit: 2023-03-15 | Discharge: 2023-03-15 | Disposition: A | Discharge status: Home / Self Care | Payer: PPO | Source: Ambulatory Visit | Attending: Cardiology | Admitting: Cardiology

## 2023-03-16 ENCOUNTER — Other Ambulatory Visit: Payer: Self-pay | Admitting: Physician Assistant

## 2023-03-18 ENCOUNTER — Encounter (HOSPITAL_COMMUNITY)
Admission: RE | Admit: 2023-03-18 | Discharge: 2023-03-18 | Disposition: A | Discharge status: Home / Self Care | Payer: PPO | Source: Ambulatory Visit | Attending: Cardiology | Admitting: Cardiology

## 2023-03-18 DIAGNOSIS — I2089 Other forms of angina pectoris: Secondary | ICD-10-CM

## 2023-03-18 DIAGNOSIS — I5022 Chronic systolic (congestive) heart failure: Secondary | ICD-10-CM

## 2023-03-19 ENCOUNTER — Other Ambulatory Visit: Payer: Self-pay

## 2023-03-19 DIAGNOSIS — Z79899 Other long term (current) drug therapy: Secondary | ICD-10-CM | POA: Diagnosis not present

## 2023-03-19 DIAGNOSIS — C44319 Basal cell carcinoma of skin of other parts of face: Secondary | ICD-10-CM | POA: Diagnosis not present

## 2023-03-19 NOTE — Progress Notes (Signed)
Repeat lab orders placed per note from Carlos Levering, NP

## 2023-03-20 ENCOUNTER — Encounter (HOSPITAL_COMMUNITY)
Admission: RE | Admit: 2023-03-20 | Discharge: 2023-03-20 | Disposition: A | Discharge status: Home / Self Care | Payer: PPO | Source: Ambulatory Visit | Attending: Cardiology | Admitting: Cardiology

## 2023-03-20 DIAGNOSIS — I2089 Other forms of angina pectoris: Secondary | ICD-10-CM | POA: Diagnosis not present

## 2023-03-20 DIAGNOSIS — I5022 Chronic systolic (congestive) heart failure: Secondary | ICD-10-CM | POA: Insufficient documentation

## 2023-03-20 LAB — BASIC METABOLIC PANEL
BUN/Creatinine Ratio: 19 (ref 10–24)
BUN: 22 mg/dL (ref 8–27)
CO2: 21 mmol/L (ref 20–29)
Calcium: 9.4 mg/dL (ref 8.6–10.2)
Chloride: 99 mmol/L (ref 96–106)
Creatinine, Ser: 1.18 mg/dL (ref 0.76–1.27)
Glucose: 124 mg/dL — ABNORMAL HIGH (ref 70–99)
Potassium: 4.9 mmol/L (ref 3.5–5.2)
Sodium: 138 mmol/L (ref 134–144)
eGFR: 66 mL/min/{1.73_m2} (ref >59)

## 2023-03-21 ENCOUNTER — Telehealth: Payer: Self-pay | Admitting: Student

## 2023-03-21 NOTE — Telephone Encounter (Signed)
Discussed labs results with patient via telephone. He reports over the last two days he has had increased shortness of breath in the morning until about 3pm then it resolves. His weight has been stable. He will trial taking an additional 10 mg of Lasix in the afternoon through the weekend. He will contact me through My Chart to let me know and we will make adjustments from there.   Etta Grandchild. Lyrique Hakim, DNP, NP-C  03/21/2023, 12:08 PM [facility] [address] Office [phone] Fax [phone]

## 2023-03-22 ENCOUNTER — Encounter (HOSPITAL_COMMUNITY)
Admission: RE | Admit: 2023-03-22 | Discharge: 2023-03-22 | Disposition: A | Discharge status: Home / Self Care | Payer: PPO | Source: Ambulatory Visit | Attending: Cardiology | Admitting: Cardiology

## 2023-03-22 DIAGNOSIS — I5022 Chronic systolic (congestive) heart failure: Secondary | ICD-10-CM

## 2023-03-22 DIAGNOSIS — I2089 Other forms of angina pectoris: Secondary | ICD-10-CM

## 2023-03-25 ENCOUNTER — Encounter (HOSPITAL_COMMUNITY)
Admission: RE | Admit: 2023-03-25 | Discharge: 2023-03-25 | Disposition: A | Discharge status: Home / Self Care | Payer: PPO | Source: Ambulatory Visit | Attending: Cardiology | Admitting: Cardiology

## 2023-03-25 DIAGNOSIS — I5042 Chronic combined systolic (congestive) and diastolic (congestive) heart failure: Secondary | ICD-10-CM

## 2023-03-25 DIAGNOSIS — I10 Essential (primary) hypertension: Secondary | ICD-10-CM

## 2023-03-25 DIAGNOSIS — I2089 Other forms of angina pectoris: Secondary | ICD-10-CM

## 2023-03-25 DIAGNOSIS — I5022 Chronic systolic (congestive) heart failure: Secondary | ICD-10-CM

## 2023-03-26 DIAGNOSIS — I10 Essential (primary) hypertension: Secondary | ICD-10-CM | POA: Diagnosis not present

## 2023-03-26 DIAGNOSIS — I5042 Chronic combined systolic (congestive) and diastolic (congestive) heart failure: Secondary | ICD-10-CM | POA: Diagnosis not present

## 2023-03-26 LAB — BASIC METABOLIC PANEL
BUN/Creatinine Ratio: 20 (ref 10–24)
BUN: 23 mg/dL (ref 8–27)
CO2: 23 mmol/L (ref 20–29)
Calcium: 10.1 mg/dL (ref 8.6–10.2)
Chloride: 97 mmol/L (ref 96–106)
Creatinine, Ser: 1.14 mg/dL (ref 0.76–1.27)
Glucose: 205 mg/dL — ABNORMAL HIGH (ref 70–99)
Potassium: 4.8 mmol/L (ref 3.5–5.2)
Sodium: 135 mmol/L (ref 134–144)
eGFR: 69 mL/min/{1.73_m2} (ref >59)

## 2023-03-26 NOTE — Progress Notes (Signed)
Cardiac Individual Treatment Plan  Patient Details  Name: Benjamin Erickson MRN: [id number] Date of Birth: 02-21-1951 Referring Provider:   Flowsheet Row INTENSIVE CARDIAC REHAB ORIENT from 02/19/2023 in [facility]  Referring Provider Rollene Rotunda, MD       Initial Encounter Date:  Flowsheet Row INTENSIVE CARDIAC REHAB ORIENT from 02/19/2023 in [facility]  Date 02/19/23       Visit Diagnosis: Chronic stable angina  Chronic systolic heart failure (HCC)  Patient's Home Medications on Admission:  Current Outpatient Medications:    acetaminophen (TYLENOL) 325 MG tablet, Take 650 mg by mouth as needed for moderate pain., Disp: , Rfl:    Alirocumab (PRALUENT) 150 MG/ML SOAJ, INJECT 150 MG INTO THE SKIN EVERY 14 (FOURTEEN) DAYS., Disp: 6 mL, Rfl: 3   aspirin EC 81 MG tablet, Take 81 mg by mouth daily. Swallow whole., Disp: , Rfl:    calcium elemental as carbonate (TUMS ULTRA 1000) 400 MG chewable tablet, Chew 2,000-3,000 mg by mouth as needed for heartburn., Disp: , Rfl:    carvedilol (COREG) 6.25 MG tablet, TAKE 1 TABLET BY MOUTH TWICE A DAY (Patient taking differently: Take 6.25 mg by mouth 2 (two) times daily with a meal.), Disp: 180 tablet, Rfl: 3   dapagliflozin propanediol (FARXIGA) 10 MG TABS tablet, Take 1 tablet (10 mg total) by mouth daily before breakfast., Disp: 90 tablet, Rfl: 3   furosemide (LASIX) 20 MG tablet, TAKE 1 TABLET BY MOUTH DAILY. MAY TAKE EXTRA DOSE IF HAS WORSENING DYSPNEA OR IF YOUR WEIGHT INCREASE BY MORE THAN 3 LBS OVERNIGHT OR 5 LBS IN A SINGLE WEEK, Disp: 135 tablet, Rfl: 1   metFORMIN (GLUCOPHAGE) 500 MG tablet, Take 1 tablet (500 mg total) by mouth 2 (two) times daily., Disp: , Rfl:    nitroGLYCERIN (NITROSTAT) 0.4 MG SL tablet, Place 1 tablet (0.4 mg total) under the tongue every 5 (five) minutes as needed for chest pain., Disp: 90 tablet,  Rfl: 3   ranolazine (RANEXA) 500 MG 12 hr tablet, Take 1 tablet (500 mg total) by mouth 2 (two) times daily., Disp: 60 tablet, Rfl: 1   rosuvastatin (CRESTOR) 20 MG tablet, Take 1 tablet (20 mg total) by mouth at bedtime., Disp: 90 tablet, Rfl: 1   sacubitril-valsartan (ENTRESTO) 24-26 MG, Take 1 tablet by mouth 2 (two) times daily., Disp: 180 tablet, Rfl: 3   sodium zirconium cyclosilicate (LOKELMA) 10 g PACK packet, Take 10 g by mouth daily., Disp: 3 packet, Rfl: 0   spironolactone (ALDACTONE) 25 MG tablet, Take 0.5 tablets (12.5 mg total) by mouth every other day., Disp: 30 tablet, Rfl: 2   tamsulosin (FLOMAX) 0.4 MG CAPS capsule, Take 0.4 mg by mouth daily., Disp: , Rfl:   Past Medical History: Past Medical History:  Diagnosis Date   Bigeminy    DM (diabetes mellitus) (HCC)    Frequent PVCs    Hyperlipidemia    Hypertension    Nocturia     Tobacco Use: Social History   Tobacco Use  Smoking Status Former   Types: Cigarettes  Smokeless Tobacco Never  Tobacco Comments   Quit 40 years ago    Labs: Review Flowsheet  More data exists      Latest Ref Rng & Units 10/07/2020 01/31/2021 07/18/2022 01/27/2023 01/28/2023  Labs for ITP Cardiac and Pulmonary Rehab  Cholestrol 0 - 200 mg/dL 086  578  469  143  -  LDL (calc) 0 - 99 mg/dL 95  59  46  57  -  HDL-C >40 mg/dL 45  40  42  32  -  Trlycerides <150 mg/dL 161  096  045  409  -  Hemoglobin A1c 4.8 - 5.6 % - - - - 6.7     Capillary Blood Glucose: Lab Results  Component Value Date   GLUCAP 137 (H) 02/27/2023   GLUCAP 175 (H) 02/27/2023   GLUCAP 115 (H) 02/25/2023   GLUCAP 147 (H) 02/25/2023   GLUCAP 103 (H) 02/19/2023     Exercise Target Goals: Exercise Program Goal: Individual exercise prescription set using results from initial 6 min walk test and THRR while considering  patient's activity barriers and safety.   Exercise Prescription Goal: Initial exercise prescription builds to 30-45 minutes a day of aerobic  activity, 2-3 days per week.  Home exercise guidelines will be given to patient during program as part of exercise prescription that the participant will acknowledge.  Activity Barriers & Risk Stratification:  Activity Barriers & Cardiac Risk Stratification - 02/19/23 1514       Activity Barriers & Cardiac Risk Stratification   Activity Barriers Joint Problems;Deconditioning;Muscular Weakness;Balance Concerns;Decreased Ventricular Function;Shortness of Breath    Cardiac Risk Stratification High             6 Minute Walk:  6 Minute Walk     Row Name 02/19/23 1144         6 Minute Walk   Phase Initial     Distance 977 feet     Walk Time 6 minutes     # of Rest Breaks 0     MPH 1.9     METS 1.62     RPE 11     Perceived Dyspnea  1     VO2 Peak 5.7     Symptoms Yes (comment)     Comments SOB, RPD = 1, Bilateral leg pain 2/10     Resting HR 84 bpm     Resting BP 104/74     Resting Oxygen Saturation  91 %     Exercise Oxygen Saturation  during 6 min walk 97 %     Max Ex. HR 91 bpm     Max Ex. BP 110/80     2 Minute Post BP 106/68              Oxygen Initial Assessment:   Oxygen Re-Evaluation:   Oxygen Discharge (Final Oxygen Re-Evaluation):   Initial Exercise Prescription:  Initial Exercise Prescription - 02/19/23 1500       Date of Initial Exercise RX and Referring Provider   Date 02/19/23    Referring Provider Rollene Rotunda, MD    Expected Discharge Date 05/03/23      NuStep   Level 1    SPM 75    Minutes 20    METs 1.6      Prescription Details   Frequency (times per week) 3    Duration Progress to 30 minutes of continuous aerobic without signs/symptoms of physical distress      Intensity   THRR 40-80% of Max Heartrate 60-119    Ratings of Perceived Exertion 11-13    Perceived Dyspnea 0-4      Progression   Progression Continue progressive overload as per policy without signs/symptoms or physical distress.      Resistance Training    Training Prescription Yes    Weight 2 lbs  Reps 10-15             Perform Capillary Blood Glucose checks as needed.  Exercise Prescription Changes:   Exercise Prescription Changes     Row Name 02/25/23 1400 03/19/23 1400           Response to Exercise   Blood Pressure (Admit) 108/70 100/62      Blood Pressure (Exercise) 118/70 110/74      Blood Pressure (Exit) 110/70 98/70      Heart Rate (Admit) 69 bpm 78 bpm      Heart Rate (Exercise) 89 bpm 98 bpm      Heart Rate (Exit) 78 bpm 80 bpm      Rating of Perceived Exertion (Exercise) 10 11      Symptoms SOB, lightheadedness None      Comments Pt's first day in the CRP2 program Reviewed METs      Duration Progress to 30 minutes of  aerobic without signs/symptoms of physical distress Continue with 30 min of aerobic exercise without signs/symptoms of physical distress.      Intensity THRR unchanged THRR unchanged        Progression   Progression Continue to progress workloads to maintain intensity without signs/symptoms of physical distress. Continue to progress workloads to maintain intensity without signs/symptoms of physical distress.      Average METs 1.7 2.2        Resistance Training   Training Prescription Yes Yes      Weight 2 lbs 3 lbs      Reps 10-15 10-15      Time 10 Minutes 10 Minutes        Interval Training   Interval Training No No        NuStep   Level 1 1      SPM -- 90      Minutes 20 30      METs 1.7 2.2               Exercise Comments:   Exercise Comments     Row Name 02/25/23 1423 03/18/23 1458         Exercise Comments Pt's first day in the CRP2 program. Pt did 20 minutes on Nustep with several rest breaks. Pt did have c/o SOB with SaO2 of 96%, RPD = 2. Reviewed METs. slow progress. Will continue to encourage increases as tolerated. Pt is now up to 30 minutes from 20 minutes.               Exercise Goals and Review:   Exercise Goals     Row Name 02/19/23 1521              Exercise Goals   Increase Physical Activity Yes       Intervention Provide advice, education, support and counseling about physical activity/exercise needs.;Develop an individualized exercise prescription for aerobic and resistive training based on initial evaluation findings, risk stratification, comorbidities and participant's personal goals.       Expected Outcomes Short Term: Attend rehab on a regular basis to increase amount of physical activity.;Long Term: Add in home exercise to make exercise part of routine and to increase amount of physical activity.;Long Term: Exercising regularly at least 3-5 days a week.       Increase Strength and Stamina Yes       Intervention Provide advice, education, support and counseling about physical activity/exercise needs.;Develop an individualized exercise prescription for aerobic and resistive training based on initial evaluation  findings, risk stratification, comorbidities and participant's personal goals.       Expected Outcomes Short Term: Increase workloads from initial exercise prescription for resistance, speed, and METs.;Short Term: Perform resistance training exercises routinely during rehab and add in resistance training at home;Long Term: Improve cardiorespiratory fitness, muscular endurance and strength as measured by increased METs and functional capacity ( )       Able to understand and use rate of perceived exertion (RPE) scale Yes       Intervention Provide education and explanation on how to use RPE scale       Expected Outcomes Short Term: Able to use RPE daily in rehab to express subjective intensity level;Long Term:  Able to use RPE to guide intensity level when exercising independently       Knowledge and understanding of Target Heart Rate Range (THRR) Yes       Intervention Provide education and explanation of THRR including how the numbers were predicted and where they are located for reference       Expected Outcomes Short Term:  Able to state/look up THRR;Short Term: Able to use daily as guideline for intensity in rehab;Long Term: Able to use THRR to govern intensity when exercising independently       Understanding of Exercise Prescription Yes       Intervention Provide education, explanation, and written materials on patient's individual exercise prescription       Expected Outcomes Short Term: Able to explain program exercise prescription;Long Term: Able to explain home exercise prescription to exercise independently                Exercise Goals Re-Evaluation :  Exercise Goals Re-Evaluation     Row Name 02/25/23 1421             Exercise Goal Re-Evaluation   Exercise Goals Review Increase Physical Activity;Understanding of Exercise Prescription;Increase Strength and Stamina;Knowledge and understanding of Target Heart Rate Range (THRR);Able to understand and use rate of perceived exertion (RPE) scale       Comments Pt's first day in the CRP2 program. Pt understands the exercise Rx, RPE scale, and THRR.       Expected Outcomes Will continue to monitor patient and progress exercise workloads as tolerated.                Discharge Exercise Prescription (Final Exercise Prescription Changes):  Exercise Prescription Changes - 03/19/23 1400       Response to Exercise   Blood Pressure (Admit) 100/62    Blood Pressure (Exercise) 110/74    Blood Pressure (Exit) 98/70    Heart Rate (Admit) 78 bpm    Heart Rate (Exercise) 98 bpm    Heart Rate (Exit) 80 bpm    Rating of Perceived Exertion (Exercise) 11    Symptoms None    Comments Reviewed METs    Duration Continue with 30 min of aerobic exercise without signs/symptoms of physical distress.    Intensity THRR unchanged      Progression   Progression Continue to progress workloads to maintain intensity without signs/symptoms of physical distress.    Average METs 2.2      Resistance Training   Training Prescription Yes    Weight 3 lbs    Reps 10-15     Time 10 Minutes      Interval Training   Interval Training No      NuStep   Level 1    SPM 90    Minutes 30  METs 2.2             Nutrition:  Target Goals: Understanding of nutrition guidelines, daily intake of sodium 1500mg , cholesterol 200mg , calories 30% from fat and 7% or less from saturated fats, daily to have 5 or more servings of fruits and vegetables.  Biometrics:  Pre Biometrics - 02/19/23 1330       Pre Biometrics   Waist Circumference 48.75 inches    Hip Circumference 45.5 inches    Waist to Hip Ratio 1.07 %    Triceps Skinfold 16 mm    % Body Fat 34.9 %    Grip Strength 35 kg    Flexibility 0 in   cannot reach   Single Leg Stand 3.5 seconds              Nutrition Therapy Plan and Nutrition Goals:  Nutrition Therapy & Goals - 02/25/23 1504       Nutrition Therapy   Diet Heart Healthy/Carbohydrate Consistent Diet    Drug/Food Interactions Statins/Certain Fruits      Personal Nutrition Goals   Nutrition Goal Patient to identify strategies for reducing cardiovascular risk by attending the Pritikin education and nutrition series weekly.    Personal Goal #2 Patient to improve diet quality by using the plate method as a guide for meal planning to include lean protein/plant protein, fruits, vegetables, whole grains, nonfat dairy as part of a well-balanced diet.    Personal Goal #3 Patient to reduce sodium to 1500mg  per day    Comments Jaylynn will benefit from participation in intensive cardiac rehab for nutrition, exercise, and lifestyle modification.      Intervention Plan   Intervention Prescribe, educate and counsel regarding individualized specific dietary modifications aiming towards targeted core components such as weight, hypertension, lipid management, diabetes, heart failure and other comorbidities.;Nutrition handout(s) given to patient.    Expected Outcomes Short Term Goal: Understand basic principles of dietary content, such as calories,  fat, sodium, cholesterol and nutrients.;Long Term Goal: Adherence to prescribed nutrition plan.             Nutrition Assessments:  Nutrition Assessments - 02/26/23 1526       Rate Your Plate Scores   Pre Score 57            MEDIFICTS Score Key: ?70 Need to make dietary changes  40-70 Heart Healthy Diet ? 40 Therapeutic Level Cholesterol Diet   Flowsheet Row INTENSIVE CARDIAC REHAB from 02/25/2023 in [facility]  Picture Your Plate Total Score on Admission 57      Picture Your Plate Scores: <16 Unhealthy dietary pattern with much room for improvement. 41-50 Dietary pattern unlikely to meet recommendations for good health and room for improvement. 51-60 More healthful dietary pattern, with some room for improvement.  >60 Healthy dietary pattern, although there may be some specific behaviors that could be improved.    Nutrition Goals Re-Evaluation:  Nutrition Goals Re-Evaluation     Row Name 02/25/23 1504             Goals   Current Weight 226 lb 6.6 oz (102.7 kg)       Comment A1c 6.7, Potassium 5.5, triglycerides 272, HDL 32, VLDL 54       Expected Outcome will benefit from participation in intensive cardiac rehab for nutrition, exercise, and lifestyle modification.                Nutrition Goals Re-Evaluation:  Nutrition Goals Re-Evaluation     Row Name 02/25/23 1504             Goals   Current Weight 226 lb 6.6 oz (102.7 kg)       Comment A1c 6.7, Potassium 5.5, triglycerides 272, HDL 32, VLDL 54       Expected Outcome will benefit from participation in intensive cardiac rehab for nutrition, exercise, and lifestyle modification.                Nutrition Goals Discharge (Final Nutrition Goals Re-Evaluation):  Nutrition Goals Re-Evaluation - 02/25/23 1504       Goals   Current Weight 226 lb 6.6 oz (102.7 kg)    Comment A1c 6.7, Potassium 5.5, triglycerides 272, HDL 32, VLDL 54     Expected Outcome will benefit from participation in intensive cardiac rehab for nutrition, exercise, and lifestyle modification.             Psychosocial: Target Goals: Acknowledge presence or absence of significant depression and/or stress, maximize coping skills, provide positive support system. Participant is able to verbalize types and ability to use techniques and skills needed for reducing stress and depression.  Initial Review & Psychosocial Screening:  Initial Psych Review & Screening - 02/19/23 1525       Initial Review   Current issues with None Identified      Family Dynamics   Good Support System? Yes    Comments Has spouse for support      Barriers   Psychosocial barriers to participate in program There are no identifiable barriers or psychosocial needs.      Screening Interventions   Interventions Encouraged to exercise             Quality of Life Scores:  Quality of Life - 02/19/23 1524       Quality of Life   Select Quality of Life      Quality of Life Scores   Health/Function Pre 21.93 %    Socioeconomic Pre 29.17 %    Psych/Spiritual Pre 28.29 %    Family Pre 30 %    GLOBAL Pre 25.82 %            Scores of 19 and below usually indicate a poorer quality of life in these areas.  A difference of  2-3 points is a clinically meaningful difference.  A difference of 2-3 points in the total score of the Quality of Life Index has been associated with significant improvement in overall quality of life, self-image, physical symptoms, and general health in studies assessing change in quality of life.  PHQ-9: Review Flowsheet       02/19/2023 07/05/2016  Depression screen PHQ 2/9  Decreased Interest 0 0  Down, Depressed, Hopeless 0 0  PHQ - 2 Score 0 0  Altered sleeping 2 -  Tired, decreased energy 3 -  Change in appetite 0 -  Feeling bad or failure about yourself  0 -  Trouble concentrating 0 -  Moving slowly or fidgety/restless 0 -   Suicidal thoughts 0 -  PHQ-9 Score 5 -  Difficult doing work/chores Not difficult at all -   Interpretation of Total Score  Total Score Depression Severity:  1-4 = Minimal depression, 5-9 = Mild depression, 10-14 = Moderate depression, 15-19 = Moderately severe depression, 20-27 = Severe depression   Psychosocial Evaluation and Intervention:   Psychosocial Re-Evaluation:  Psychosocial Re-Evaluation     Row Name 02/26/23 [phone] 03/26/23 1751  Psychosocial Re-Evaluation   Current issues with None Identified None Identified      Interventions Encouraged to attend Cardiac Rehabilitation for the exercise Encouraged to attend Cardiac Rehabilitation for the exercise      Continue Psychosocial Services  No Follow up required No Follow up required               Psychosocial Discharge (Final Psychosocial Re-Evaluation):  Psychosocial Re-Evaluation - 03/26/23 1751       Psychosocial Re-Evaluation   Current issues with None Identified    Interventions Encouraged to attend Cardiac Rehabilitation for the exercise    Continue Psychosocial Services  No Follow up required             Vocational Rehabilitation: Provide vocational rehab assistance to qualifying candidates.   Vocational Rehab Evaluation & Intervention:  Vocational Rehab - 02/19/23 1526       Initial Vocational Rehab Evaluation & Intervention   Assessment shows need for Vocational Rehabilitation No   Pt is retired            Education: Education Goals: Education classes will be provided on a weekly basis, covering required topics. Participant will state understanding/return demonstration of topics presented.    Education     Row Name 02/25/23 1500     Education   Cardiac Education Topics Pritikin   Psychologist, counselling   Select Nutrition   Nutrition Nutrition Action Plan   Instruction Review Code 1- Verbalizes Understanding   Class Start Time 1405    Class Stop Time 1440   Class Time Calculation (min) 35 min    Row Name 02/27/23 1600     Education   Cardiac Education Topics Pritikin   Customer service manager   Weekly Topic Efficiency Cooking - Meals in a Snap   Instruction Review Code 1- Verbalizes Understanding   Class Start Time 1400   Class Stop Time 1445   Class Time Calculation (min) 45 min    Row Name 03/01/23 1400     Education   Cardiac Education Topics Pritikin   Psychologist, forensic Exercise Education   Exercise Education Move It!   Instruction Review Code 1- Verbalizes Understanding   Class Start Time 1359   Class Stop Time 1433   Class Time Calculation (min) 34 min    Row Name 03/04/23 1600     Education   Cardiac Education Topics Pritikin   Immunologist Exercise Physiologist   Select Psychosocial   Psychosocial Workshop Focused Goals, Sustainable Changes   Instruction Review Code 1- Verbalizes Understanding   Class Start Time 1400   Class Stop Time 1441   Class Time Calculation (min) 41 min    Row Name 03/08/23 1600     Education   Cardiac Education Topics Pritikin   Hospital doctor Education   General Education Hypertension and Heart Disease   Instruction Review Code 1- Verbalizes Understanding   Class Start Time 1410   Class Stop Time 1455   Class Time Calculation (min) 45 min    Row Name 03/11/23 1400     Education   Cardiac Education Topics Pritikin   Psychologist, sport and exercise  Core Videos   Educator Exercise Physiologist   Select Exercise Education   Exercise Education Biomechanial Limitations   Instruction Review Code 1- Verbalizes Understanding   Class Start Time 1407   Class Stop Time 1444   Class Time Calculation (min) 37 min    Row Name 03/18/23 1500     Education   Cardiac  Education Topics Pritikin   Select Core Videos     Core Videos   Educator Nurse   Nutrition Facts on Fat   Instruction Review Code 1- Verbalizes Understanding   Class Start Time 1400   Class Stop Time 1439   Class Time Calculation (min) 39 min    Row Name 03/20/23 1600     Education   Cardiac Education Topics Pritikin   Customer service manager   Weekly Topic Fast Evening Meals   Instruction Review Code 1- Verbalizes Understanding   Class Start Time 1400   Class Stop Time 1440   Class Time Calculation (min) 40 min    Row Name 03/25/23 1700     Education   Cardiac Education Topics Pritikin   [employer]     Workshops   Educator Exercise Physiologist   Select Psychosocial   Psychosocial Workshop Healthy Sleep for a Healthy Heart   Instruction Review Code 1- Verbalizes Understanding   Class Start Time 1405   Class Stop Time 1502   Class Time Calculation (min) 57 min            Core Videos: Exercise    Move It!  Clinical staff conducted group or individual video education with verbal and written material and guidebook.  Patient learns the recommended Pritikin exercise program. Exercise with the goal of living a long, healthy life. Some of the health benefits of exercise include controlled diabetes, healthier blood pressure levels, improved cholesterol levels, improved heart and lung capacity, improved sleep, and better body composition. Everyone should speak with their doctor before starting or changing an exercise routine.  Biomechanical Limitations Clinical staff conducted group or individual video education with verbal and written material and guidebook.  Patient learns how biomechanical limitations can impact exercise and how we can mitigate and possibly overcome limitations to have an impactful and balanced exercise routine.  Body Composition Clinical staff conducted group or individual video education with verbal and  written material and guidebook.  Patient learns that body composition (ratio of muscle mass to fat mass) is a key component to assessing overall fitness, rather than body weight alone. Increased fat mass, especially visceral belly fat, can put [place] at increased risk for metabolic syndrome, type 2 diabetes, heart disease, and even death. It is recommended to combine diet and exercise (cardiovascular and resistance training) to improve your body composition. Seek guidance from your physician and exercise physiologist before implementing an exercise routine.  Exercise Action Plan Clinical staff conducted group or individual video education with verbal and written material and guidebook.  Patient learns the recommended strategies to achieve and enjoy long-term exercise adherence, including variety, self-motivation, self-efficacy, and positive decision making. Benefits of exercise include fitness, good health, weight management, more energy, better sleep, less stress, and overall well-being.  Medical   Heart Disease Risk Reduction Clinical staff conducted group or individual video education with verbal and written material and guidebook.  Patient learns our heart is our most vital organ as it circulates oxygen, nutrients, white blood cells, and hormones throughout the entire body, and carries  waste away. Data supports a plant-based eating plan like the Pritikin Program for its effectiveness in slowing progression of and reversing heart disease. The video provides a number of recommendations to address heart disease.   Metabolic Syndrome and Belly Fat  Clinical staff conducted group or individual video education with verbal and written material and guidebook.  Patient learns what metabolic syndrome is, how it leads to heart disease, and how one can reverse it and keep it from coming back. You have metabolic syndrome if you have 3 of the following 5 criteria: abdominal obesity, high blood pressure, high  triglycerides, low HDL cholesterol, and high blood sugar.  Hypertension and Heart Disease Clinical staff conducted group or individual video education with verbal and written material and guidebook.  Patient learns that high blood pressure, or hypertension, is very common in the [place]. Hypertension is largely due to excessive salt intake, but other important risk factors include being overweight, physical inactivity, drinking too much alcohol, smoking, and not eating enough potassium from fruits and vegetables. High blood pressure is a leading risk factor for heart attack, stroke, congestive heart failure, dementia, kidney failure, and premature death. Long-term effects of excessive salt intake include stiffening of the arteries and thickening of heart muscle and organ damage. Recommendations include ways to reduce hypertension and the risk of heart disease.  Diseases of Our Time - Focusing on Diabetes Clinical staff conducted group or individual video education with verbal and written material and guidebook.  Patient learns why the best way to stop diseases of our time is prevention, through food and other lifestyle changes. Medicine (such as prescription pills and surgeries) is often only a Band-Aid on the problem, not a long-term solution. Most common diseases of our time include obesity, type 2 diabetes, hypertension, heart disease, and cancer. The Pritikin Program is recommended and has been proven to help reduce, reverse, and/or prevent the damaging effects of metabolic syndrome.  Nutrition   Overview of the Pritikin Eating Plan  Clinical staff conducted group or individual video education with verbal and written material and guidebook.  Patient learns about the Pritikin Eating Plan for disease risk reduction. The Pritikin Eating Plan emphasizes a wide variety of unrefined, minimally-processed carbohydrates, like fruits, vegetables, whole grains, and legumes. Go, Caution, and Stop food  choices are explained. Plant-based and lean animal proteins are emphasized. Rationale provided for low sodium intake for blood pressure control, low added sugars for blood sugar stabilization, and low added fats and oils for coronary artery disease risk reduction and weight management.  Calorie Density  Clinical staff conducted group or individual video education with verbal and written material and guidebook.  Patient learns about calorie density and how it impacts the Pritikin Eating Plan. Knowing the characteristics of the food you choose will help you decide whether those foods will lead to weight gain or weight loss, and whether you want to consume more or less of them. Weight loss is usually a side effect of the Pritikin Eating Plan because of its focus on low calorie-dense foods.  Label Reading  Clinical staff conducted group or individual video education with verbal and written material and guidebook.  Patient learns about the Pritikin recommended label reading guidelines and corresponding recommendations regarding calorie density, added sugars, sodium content, and whole grains.  Dining Out - Part 1  Clinical staff conducted group or individual video education with verbal and written material and guidebook.  Patient learns that restaurant meals can be sabotaging because they can be  so high in calories, fat, sodium, and/or sugar. Patient learns recommended strategies on how to positively address this and avoid unhealthy pitfalls.  Facts on Fats  Clinical staff conducted group or individual video education with verbal and written material and guidebook.  Patient learns that lifestyle modifications can be just as effective, if not more so, as many medications for lowering your risk of heart disease. A Pritikin lifestyle can help to reduce your risk of inflammation and atherosclerosis (cholesterol build-up, or plaque, in the artery walls). Lifestyle interventions such as dietary choices and  physical activity address the cause of atherosclerosis. A review of the types of fats and their impact on blood cholesterol levels, along with dietary recommendations to reduce fat intake is also included.  Nutrition Action Plan  Clinical staff conducted group or individual video education with verbal and written material and guidebook.  Patient learns how to incorporate Pritikin recommendations into their lifestyle. Recommendations include planning and keeping personal health goals in mind as an important part of their success.  Healthy Mind-Set    Healthy Minds, Bodies, Hearts  Clinical staff conducted group or individual video education with verbal and written material and guidebook.  Patient learns how to identify when they are stressed. Video will discuss the impact of that stress, as well as the many benefits of stress management. Patient will also be introduced to stress management techniques. The way we think, act, and feel has an impact on our hearts.  How Our Thoughts Can Heal Our Hearts  Clinical staff conducted group or individual video education with verbal and written material and guidebook.  Patient learns that negative thoughts can cause depression and anxiety. This can result in negative lifestyle behavior and serious health problems. Cognitive behavioral therapy is an effective method to help control our thoughts in order to change and improve our emotional outlook.  Additional Videos:  Exercise    Improving Performance  Clinical staff conducted group or individual video education with verbal and written material and guidebook.  Patient learns to use a non-linear approach by alternating intensity levels and lengths of time spent exercising to help burn more calories and lose more body fat. Cardiovascular exercise helps improve heart health, metabolism, hormonal balance, blood sugar control, and recovery from fatigue. Resistance training improves strength, endurance, balance,  coordination, reaction time, metabolism, and muscle mass. Flexibility exercise improves circulation, posture, and balance. Seek guidance from your physician and exercise physiologist before implementing an exercise routine and learn your capabilities and proper form for all exercise.  Introduction to Yoga  Clinical staff conducted group or individual video education with verbal and written material and guidebook.  Patient learns about yoga, a discipline of the coming together of mind, breath, and body. The benefits of yoga include improved flexibility, improved range of motion, better posture and core strength, increased lung function, weight loss, and positive self-image. Yoga's heart health benefits include lowered blood pressure, healthier heart rate, decreased cholesterol and triglyceride levels, improved immune function, and reduced stress. Seek guidance from your physician and exercise physiologist before implementing an exercise routine and learn your capabilities and proper form for all exercise.  Medical   Aging: Enhancing Your Quality of Life  Clinical staff conducted group or individual video education with verbal and written material and guidebook.  Patient learns key strategies and recommendations to stay in good physical health and enhance quality of life, such as prevention strategies, having an advocate, securing a Health Care Proxy and Power of [place], and keeping a  list of medications and system for tracking them. It also discusses how to avoid risk for bone loss.  Biology of Weight Control  Clinical staff conducted group or individual video education with verbal and written material and guidebook.  Patient learns that weight gain occurs because we consume more calories than we burn (eating more, moving less). Even if your body weight is normal, you may have higher ratios of fat compared to muscle mass. Too much body fat puts you at increased risk for cardiovascular disease, heart  attack, stroke, type 2 diabetes, and obesity-related cancers. In addition to exercise, following the Pritikin Eating Plan can help reduce your risk.  Decoding Lab Results  Clinical staff conducted group or individual video education with verbal and written material and guidebook.  Patient learns that lab test reflects one measurement whose values change over time and are influenced by many factors, including medication, stress, sleep, exercise, food, hydration, pre-existing medical conditions, and more. It is recommended to use the knowledge from this video to become more involved with your lab results and evaluate your numbers to speak with your doctor.   Diseases of Our Time - Overview  Clinical staff conducted group or individual video education with verbal and written material and guidebook.  Patient learns that according to the CDC, 50% to 70% of chronic diseases (such as obesity, type 2 diabetes, elevated lipids, hypertension, and heart disease) are avoidable through lifestyle improvements including healthier food choices, listening to satiety cues, and increased physical activity.  Sleep Disorders Clinical staff conducted group or individual video education with verbal and written material and guidebook.  Patient learns how good quality and duration of sleep are important to overall health and well-being. Patient also learns about sleep disorders and how they impact health along with recommendations to address them, including discussing with a physician.  Nutrition  Dining Out - Part 2 Clinical staff conducted group or individual video education with verbal and written material and guidebook.  Patient learns how to plan ahead and communicate in order to maximize their dining experience in a healthy and nutritious manner. Included are recommended food choices based on the type of restaurant the patient is visiting.   Fueling a Banker conducted group or individual  video education with verbal and written material and guidebook.  There is a strong connection between our food choices and our health. Diseases like obesity and type 2 diabetes are very prevalent and are in large-part due to lifestyle choices. The Pritikin Eating Plan provides plenty of food and hunger-curbing satisfaction. It is easy to follow, affordable, and helps reduce health risks.  Menu Workshop  Clinical staff conducted group or individual video education with verbal and written material and guidebook.  Patient learns that restaurant meals can sabotage health goals because they are often packed with calories, fat, sodium, and sugar. Recommendations include strategies to plan ahead and to communicate with the manager, chef, or server to help order a healthier meal.  Planning Your Eating Strategy  Clinical staff conducted group or individual video education with verbal and written material and guidebook.  Patient learns about the Pritikin Eating Plan and its benefit of reducing the risk of disease. The Pritikin Eating Plan does not focus on calories. Instead, it emphasizes high-quality, nutrient-rich foods. By knowing the characteristics of the foods, we choose, we can determine their calorie density and make informed decisions.  Targeting Your Nutrition Priorities  Clinical staff conducted group or individual video education with verbal  and written material and guidebook.  Patient learns that lifestyle habits have a tremendous impact on disease risk and progression. This video provides eating and physical activity recommendations based on your personal health goals, such as reducing LDL cholesterol, losing weight, preventing or controlling type 2 diabetes, and reducing high blood pressure.  Vitamins and Minerals  Clinical staff conducted group or individual video education with verbal and written material and guidebook.  Patient learns different ways to obtain key vitamins and minerals,  including through a recommended healthy diet. It is important to discuss all supplements you take with your doctor.   Healthy Mind-Set    Smoking Cessation  Clinical staff conducted group or individual video education with verbal and written material and guidebook.  Patient learns that cigarette smoking and tobacco addiction pose a serious health risk which affects millions of people. Stopping smoking will significantly reduce the risk of heart disease, lung disease, and many forms of cancer. Recommended strategies for quitting are covered, including working with your doctor to develop a successful plan.  Culinary   Becoming a Set designer conducted group or individual video education with verbal and written material and guidebook.  Patient learns that cooking at home can be healthy, cost-effective, quick, and puts them in control. Keys to cooking healthy recipes will include looking at your recipe, assessing your equipment needs, planning ahead, making it simple, choosing cost-effective seasonal ingredients, and limiting the use of added fats, salts, and sugars.  Cooking - Breakfast and Snacks  Clinical staff conducted group or individual video education with verbal and written material and guidebook.  Patient learns how important breakfast is to satiety and nutrition through the entire day. Recommendations include key foods to eat during breakfast to help stabilize blood sugar levels and to prevent overeating at meals later in the day. Planning ahead is also a key component.  Cooking - Educational psychologist conducted group or individual video education with verbal and written material and guidebook.  Patient learns eating strategies to improve overall health, including an approach to cook more at home. Recommendations include thinking of animal protein as a side on your plate rather than center stage and focusing instead on lower calorie dense options like vegetables,  fruits, whole grains, and plant-based proteins, such as beans. Making sauces in large quantities to freeze for later and leaving the skin on your vegetables are also recommended to maximize your experience.  Cooking - Healthy Salads and Dressing Clinical staff conducted group or individual video education with verbal and written material and guidebook.  Patient learns that vegetables, fruits, whole grains, and legumes are the foundations of the Pritikin Eating Plan. Recommendations include how to incorporate each of these in flavorful and healthy salads, and how to create homemade salad dressings. Proper handling of ingredients is also covered. Cooking - Soups and [employer] - Soups and Desserts Clinical staff conducted group or individual video education with verbal and written material and guidebook.  Patient learns that Pritikin soups and desserts make for easy, nutritious, and delicious snacks and meal components that are low in sodium, fat, sugar, and calorie density, while high in vitamins, minerals, and filling fiber. Recommendations include simple and healthy ideas for soups and desserts.   Overview     The Pritikin Solution Program Overview Clinical staff conducted group or individual video education with verbal and written material and guidebook.  Patient learns that the results of the Pritikin Program have been documented in  more than 100 articles published in peer-reviewed journals, and the benefits include reducing risk factors for (and, in some cases, even reversing) high cholesterol, high blood pressure, type 2 diabetes, obesity, and more! An overview of the three key pillars of the Pritikin Program will be covered: eating well, doing regular exercise, and having a healthy mind-set.  WORKSHOPS  Exercise: Exercise Basics: Building Your Action Plan Clinical staff led group instruction and group discussion with PowerPoint presentation and patient guidebook. To enhance the  learning environment the use of posters, models and videos may be added. At the conclusion of this workshop, patients will comprehend the difference between physical activity and exercise, as well as the benefits of incorporating both, into their routine. Patients will understand the FITT (Frequency, Intensity, Time, and Type) principle and how to use it to build an exercise action plan. In addition, safety concerns and other considerations for exercise and cardiac rehab will be addressed by the presenter. The purpose of this lesson is to promote a comprehensive and effective weekly exercise routine in order to improve patients' overall level of fitness.   Managing Heart Disease: Your Path to a Healthier Heart Clinical staff led group instruction and group discussion with PowerPoint presentation and patient guidebook. To enhance the learning environment the use of posters, models and videos may be added.At the conclusion of this workshop, patients will understand the anatomy and physiology of the heart. Additionally, they will understand how Pritikin's three pillars impact the risk factors, the progression, and the management of heart disease.  The purpose of this lesson is to provide a high-level overview of the heart, heart disease, and how the Pritikin lifestyle positively impacts risk factors.  Exercise Biomechanics Clinical staff led group instruction and group discussion with PowerPoint presentation and patient guidebook. To enhance the learning environment the use of posters, models and videos may be added. Patients will learn how the structural parts of their bodies function and how these functions impact their daily activities, movement, and exercise. Patients will learn how to promote a neutral spine, learn how to manage pain, and identify ways to improve their physical movement in order to promote healthy living. The purpose of this lesson is to expose patients to common  physical limitations that impact physical activity. Participants will learn practical ways to adapt and manage aches and pains, and to minimize their effect on regular exercise. Patients will learn how to maintain good posture while sitting, walking, and lifting.  Balance Training and Fall Prevention  Clinical staff led group instruction and group discussion with PowerPoint presentation and patient guidebook. To enhance the learning environment the use of posters, models and videos may be added. At the conclusion of this workshop, patients will understand the importance of their sensorimotor skills (vision, proprioception, and the vestibular system) in maintaining their ability to balance as they age. Patients will apply a variety of balancing exercises that are appropriate for their current level of function. Patients will understand the common causes for poor balance, possible solutions to these problems, and ways to modify their physical environment in order to minimize their fall risk. The purpose of this lesson is to teach patients about the importance of maintaining balance as they age and ways to minimize their risk of falling.  WORKSHOPS   Nutrition:  Fueling a Ship broker led group instruction and group discussion with PowerPoint presentation and patient guidebook. To enhance the learning environment the use of posters, models and videos may be added. Patients will  review the foundational principles of the Pritikin Eating Plan and understand what constitutes a serving size in each of the food groups. Patients will also learn Pritikin-friendly foods that are better choices when away from home and review make-ahead meal and snack options. Calorie density will be reviewed and applied to three nutrition priorities: weight maintenance, weight loss, and weight gain. The purpose of this lesson is to reinforce (in a group setting) the key concepts around what patients are  recommended to eat and how to apply these guidelines when away from home by planning and selecting Pritikin-friendly options. Patients will understand how calorie density may be adjusted for different weight management goals.  Mindful Eating  Clinical staff led group instruction and group discussion with PowerPoint presentation and patient guidebook. To enhance the learning environment the use of posters, models and videos may be added. Patients will briefly review the concepts of the Pritikin Eating Plan and the importance of low-calorie dense foods. The concept of mindful eating will be introduced as well as the importance of paying attention to internal hunger signals. Triggers for non-hunger eating and techniques for dealing with triggers will be explored. The purpose of this lesson is to provide patients with the opportunity to review the basic principles of the Pritikin Eating Plan, discuss the value of eating mindfully and how to measure internal cues of hunger and fullness using the Hunger Scale. Patients will also discuss reasons for non-hunger eating and learn strategies to use for controlling emotional eating.  Targeting Your Nutrition Priorities Clinical staff led group instruction and group discussion with PowerPoint presentation and patient guidebook. To enhance the learning environment the use of posters, models and videos may be added. Patients will learn how to determine their genetic susceptibility to disease by reviewing their family history. Patients will gain insight into the importance of diet as part of an overall healthy lifestyle in mitigating the impact of genetics and other environmental insults. The purpose of this lesson is to provide patients with the opportunity to assess their personal nutrition priorities by looking at their family history, their own health history and current risk factors. Patients will also be able to discuss ways of prioritizing and modifying the Pritikin  Eating Plan for their highest risk areas  Menu  Clinical staff led group instruction and group discussion with PowerPoint presentation and patient guidebook. To enhance the learning environment the use of posters, models and videos may be added. Using menus brought in from [employer], or printed from [employer], patients will apply the Pritikin dining out guidelines that were presented in the [employer] video. Patients will also be able to practice these guidelines in a variety of provided scenarios. The purpose of this lesson is to provide patients with the opportunity to practice hands-on learning of the Pritikin Dining Out guidelines with actual menus and practice scenarios.  Label Reading Clinical staff led group instruction and group discussion with PowerPoint presentation and patient guidebook. To enhance the learning environment the use of posters, models and videos may be added. Patients will review and discuss the Pritikin label reading guidelines presented in Pritikin's Label Reading Educational series video. Using fool labels brought in from local grocery stores and markets, patients will apply the label reading guidelines and determine if the packaged food meet the Pritikin guidelines. The purpose of this lesson is to provide patients with the opportunity to review, discuss, and practice hands-on learning of the Pritikin Label Reading guidelines with actual packaged food labels.  Cooking School  Pritikin's [employer] are designed to teach patients ways to prepare quick, simple, and affordable recipes at home. The importance of nutrition's role in chronic disease risk reduction is reflected in its emphasis in the overall Pritikin program. By learning how to prepare essential core Pritikin Eating Plan recipes, patients will increase control over what they eat; be able to customize the flavor of foods without the use of added salt, sugar, or fat; and  improve the quality of the food they consume. By learning a set of core recipes which are easily assembled, quickly prepared, and affordable, patients are more likely to prepare more healthy foods at home. These workshops focus on convenient breakfasts, simple entres, side dishes, and desserts which can be prepared with minimal effort and are consistent with nutrition recommendations for cardiovascular risk reduction. Cooking [employer] are taught by a Armed forces logistics/support/administrative officer (RD) who has been trained by the [employer]. The chef or RD has a clear understanding of the importance of minimizing - if not completely eliminating - added fat, sugar, and sodium in recipes. Throughout the series of Cooking School Workshop sessions, patients will learn about healthy ingredients and efficient methods of cooking to build confidence in their capability to prepare    Cooking School weekly topics:  Adding Flavor- Sodium-Free  Fast and Healthy Breakfasts  Powerhouse Plant-Based Proteins  Satisfying Salads and Dressings  Simple Sides and Sauces  International Cuisine-Spotlight on the [employer] Zones  Delicious Desserts  Savory Soups  [employer] - Meals in a Astronomer Appetizers and Snacks  Comforting Weekend Breakfasts  One-Pot Wonders   Fast Evening Meals  Landscape architect Your Pritikin Plate  WORKSHOPS   Healthy Mindset (Psychosocial):  Focused Goals, Sustainable Changes Clinical staff led group instruction and group discussion with PowerPoint presentation and patient guidebook. To enhance the learning environment the use of posters, models and videos may be added. Patients will be able to apply effective goal setting strategies to establish at least one personal goal, and then take consistent, meaningful action toward that goal. They will learn to identify common barriers to achieving personal goals and develop strategies to overcome them. Patients will  also gain an understanding of how our mind-set can impact our ability to achieve goals and the importance of cultivating a positive and growth-oriented mind-set. The purpose of this lesson is to provide patients with a deeper understanding of how to set and achieve personal goals, as well as the tools and strategies needed to overcome common obstacles which may arise along the way.  From Head to Heart: The Power of a Healthy Outlook  Clinical staff led group instruction and group discussion with PowerPoint presentation and patient guidebook. To enhance the learning environment the use of posters, models and videos may be added. Patients will be able to recognize and describe the impact of emotions and mood on physical health. They will discover the importance of self-care and explore self-care practices which may work for them. Patients will also learn how to utilize the 4 C's to cultivate a healthier outlook and better manage stress and challenges. The purpose of this lesson is to demonstrate to patients how a healthy outlook is an essential part of maintaining good health, especially as they continue their cardiac rehab journey.  Healthy Sleep for a Healthy Heart Clinical staff led group instruction and group discussion with PowerPoint presentation and patient guidebook. To enhance the learning environment the use of posters,  models and videos may be added. At the conclusion of this workshop, patients will be able to demonstrate knowledge of the importance of sleep to overall health, well-being, and quality of life. They will understand the symptoms of, and treatments for, common sleep disorders. Patients will also be able to identify daytime and nighttime behaviors which impact sleep, and they will be able to apply these tools to help manage sleep-related challenges. The purpose of this lesson is to provide patients with a general overview of sleep and outline the importance of quality sleep. Patients will  learn about a few of the most common sleep disorders. Patients will also be introduced to the concept of "sleep hygiene," and discover ways to self-manage certain sleeping problems through simple daily behavior changes. Finally, the workshop will motivate patients by clarifying the links between quality sleep and their goals of heart-healthy living.   Recognizing and Reducing Stress Clinical staff led group instruction and group discussion with PowerPoint presentation and patient guidebook. To enhance the learning environment the use of posters, models and videos may be added. At the conclusion of this workshop, patients will be able to understand the types of stress reactions, differentiate between acute and chronic stress, and recognize the impact that chronic stress has on their health. They will also be able to apply different coping mechanisms, such as reframing negative self-talk. Patients will have the opportunity to practice a variety of stress management techniques, such as deep abdominal breathing, progressive muscle relaxation, and/or guided imagery.  The purpose of this lesson is to educate patients on the role of stress in their lives and to provide healthy techniques for coping with it.  Learning Barriers/Preferences:  Learning Barriers/Preferences - 02/19/23 1528       Learning Barriers/Preferences   Learning Barriers Sight;Hearing   wears glasses; HOH left ear   Learning Preferences Written Material;Computer/Internet;Pictoral;Video             Education Topics:  Knowledge Questionnaire Score:  Knowledge Questionnaire Score - 02/19/23 1527       Knowledge Questionnaire Score   Pre Score 20/24             Core Components/Risk Factors/Patient Goals at Admission:  Personal Goals and Risk Factors at Admission - 02/19/23 1526       Core Components/Risk Factors/Patient Goals on Admission    Weight Management Yes;Obesity;Weight Loss    Intervention Weight Management:  Develop a combined nutrition and exercise program designed to reach desired caloric intake, while maintaining appropriate intake of nutrient and fiber, sodium and fats, and appropriate energy expenditure required for the weight goal.;Weight Management: Provide education and appropriate resources to help participant work on and attain dietary goals.;Weight Management/Obesity: Establish reasonable short term and long term weight goals.;Obesity: Provide education and appropriate resources to help participant work on and attain dietary goals.    Admit Weight 227 lb 4.7 oz (103.1 kg)    Goal Weight: Long Term 200 lb (90.7 kg)    Expected Outcomes Short Term: Continue to assess and modify interventions until short term weight is achieved;Long Term: Adherence to nutrition and physical activity/exercise program aimed toward attainment of established weight goal;Weight Loss: Understanding of general recommendations for a balanced deficit meal plan, which promotes 1-2 lb weight loss per week and includes a negative energy balance of [phone] kcal/d;Understanding recommendations for meals to include 15-35% energy as protein, 25-35% energy from fat, 35-60% energy from carbohydrates, less than 200mg  of dietary cholesterol, 20-35 gm of total fiber daily;Understanding of  distribution of calorie intake throughout the day with the consumption of 4-5 meals/snacks    Diabetes Yes    Intervention Provide education about signs/symptoms and action to take for hypo/hyperglycemia.;Provide education about proper nutrition, including hydration, and aerobic/resistive exercise prescription along with prescribed medications to achieve blood glucose in normal ranges: Fasting glucose 65-99 mg/dL    Expected Outcomes Short Term: Participant verbalizes understanding of the signs/symptoms and immediate care of hyper/hypoglycemia, proper foot care and importance of medication, aerobic/resistive exercise and nutrition plan for blood glucose  control.;Long Term: Attainment of HbA1C < 7%.    Heart Failure Yes    Intervention Provide a combined exercise and nutrition program that is supplemented with education, support and counseling about heart failure. Directed toward relieving symptoms such as shortness of breath, decreased exercise tolerance, and extremity edema.    Expected Outcomes Long term: Adoption of self-care skills and reduction of barriers for early signs and symptoms recognition and intervention leading to self-care maintenance.;Short term: Daily weights obtained and reported for increase. Utilizing diuretic protocols set by physician.;Short term: Attendance in program 2-3 days a week with increased exercise capacity. Reported lower sodium intake. Reported increased fruit and vegetable intake. Reports medication compliance.    Hypertension Yes    Intervention Provide education on lifestyle modifcations including regular physical activity/exercise, weight management, moderate sodium restriction and increased consumption of fresh fruit, vegetables, and low fat dairy, alcohol moderation, and smoking cessation.;Monitor prescription use compliance.    Expected Outcomes Short Term: Continued assessment and intervention until BP is < 140/75mm HG in hypertensive participants. < 130/21mm HG in hypertensive participants with diabetes, heart failure or chronic kidney disease.;Long Term: Maintenance of blood pressure at goal levels.    Lipids Yes    Intervention Provide education and support for participant on nutrition & aerobic/resistive exercise along with prescribed medications to achieve LDL 70mg , HDL >40mg .    Expected Outcomes Short Term: Participant states understanding of desired cholesterol values and is compliant with medications prescribed. Participant is following exercise prescription and nutrition guidelines.;Long Term: Cholesterol controlled with medications as prescribed, with individualized exercise RX and with personalized  nutrition plan. Value goals: LDL < 70mg , HDL > 40 mg.             Core Components/Risk Factors/Patient Goals Review:   Goals and Risk Factor Review     Row Name 02/26/23 0744 03/26/23 1752           Core Components/Risk Factors/Patient Goals Review   Personal Goals Review Weight Management/Obesity;Heart Failure;Diabetes;Hypertension;Lipids Weight Management/Obesity;Heart Failure;Diabetes;Hypertension;Lipids      Review Zamari started intrnsive cardiac rehab on 02/25/23 and did fair with exercise. Zyon reported some shortness of breath and feeling lightheaded. Orthostatic BP's WNL. CBG's WNL Ernesto is doing well with exercise at intensive cardiac rehab. Vital signs and CBg's and weights  have been stable.      Expected Outcomes Quamari will continue to participate in intensive cardiac rehab for exercise, nutrition and lifestyle modifications. Vincil will continue to participate in intensive cardiac rehab for exercise, nutrition and lifestyle modifications.               Core Components/Risk Factors/Patient Goals at Discharge (Final Review):   Goals and Risk Factor Review - 03/26/23 1752       Core Components/Risk Factors/Patient Goals Review   Personal Goals Review Weight Management/Obesity;Heart Failure;Diabetes;Hypertension;Lipids    Review Olis is doing well with exercise at intensive cardiac rehab. Vital signs and CBg's and weights  have been stable.  Expected Outcomes Dominyck will continue to participate in intensive cardiac rehab for exercise, nutrition and lifestyle modifications.             ITP Comments:  ITP Comments     Row Name 02/19/23 1043 02/26/23 0741 03/26/23 1750       ITP Comments Armanda Magic, MD: Medical Director.  Intorduction to the [employer].  Initial orientation packet reviewed with the patient. 30 Day ITP Review. Rai started intensive cardiac rehab on 02/25/23 and did fair with exercise for his  fitness level. Izac is somewhat decondtioned. 30 Day ITP Review. Andrzej has good attendance and participation in  intensive cardiac rehab.              Comments: See ITP comments

## 2023-03-27 ENCOUNTER — Encounter (HOSPITAL_COMMUNITY)
Admission: RE | Admit: 2023-03-27 | Discharge: 2023-03-27 | Disposition: A | Discharge status: Home / Self Care | Payer: PPO | Source: Ambulatory Visit | Attending: Cardiology | Admitting: Cardiology

## 2023-03-27 DIAGNOSIS — I5022 Chronic systolic (congestive) heart failure: Secondary | ICD-10-CM

## 2023-03-27 DIAGNOSIS — I2089 Other forms of angina pectoris: Secondary | ICD-10-CM

## 2023-03-29 ENCOUNTER — Encounter (HOSPITAL_COMMUNITY)
Admission: RE | Admit: 2023-03-29 | Discharge: 2023-03-29 | Disposition: A | Discharge status: Home / Self Care | Payer: PPO | Source: Ambulatory Visit | Attending: Cardiology | Admitting: Cardiology

## 2023-03-29 DIAGNOSIS — I2089 Other forms of angina pectoris: Secondary | ICD-10-CM

## 2023-03-29 DIAGNOSIS — I5022 Chronic systolic (congestive) heart failure: Secondary | ICD-10-CM

## 2023-04-01 ENCOUNTER — Encounter (HOSPITAL_COMMUNITY): Payer: PPO

## 2023-04-03 ENCOUNTER — Telehealth (HOSPITAL_COMMUNITY): Payer: Self-pay | Admitting: *Deleted

## 2023-04-03 ENCOUNTER — Encounter (HOSPITAL_COMMUNITY): Payer: PPO

## 2023-04-03 ENCOUNTER — Telehealth (HOSPITAL_COMMUNITY): Payer: Self-pay

## 2023-04-03 NOTE — Telephone Encounter (Signed)
Left message to call cardiac rehab.Tereso Unangst Walden Rondel Episcopo RN BSN  

## 2023-04-03 NOTE — Telephone Encounter (Signed)
Pt called and decided he no longer wanted to attend rehab. He did ask for [place] to cancel all remaninig appointments.

## 2023-04-04 NOTE — Progress Notes (Signed)
Discharge Progress Report  Patient Details  Name: Benjamin Erickson MRN: [id number] Date of Birth: [birth year] Referring Provider:   Flowsheet Row INTENSIVE CARDIAC REHAB ORIENT from 02/19/2023 in [facility]  Referring Provider Rollene Rotunda, MD        Number of Visits: 23  Reason for Discharge:  Early Exit:  Benjamin Erickson stopped participating in the program he did not say why  Smoking History:  Social History   Tobacco Use  Smoking Status Former   Types: Cigarettes  Smokeless Tobacco Never  Tobacco Comments   Quit 40 years ago    Diagnosis:  Chronic stable angina  Chronic systolic heart failure (HCC)  ADL [facility]:   Initial Exercise Prescription:  Initial Exercise Prescription - 02/19/23 1500       Date of Initial Exercise RX and Referring Provider   Date 02/19/23    Referring Provider Rollene Rotunda, MD    Expected Discharge Date 05/03/23      NuStep   Level 1    SPM 75    Minutes 20    METs 1.6      Prescription Details   Frequency (times per week) 3    Duration Progress to 30 minutes of continuous aerobic without signs/symptoms of physical distress      Intensity   THRR 40-80% of Max Heartrate 60-119    Ratings of Perceived Exertion 11-13    Perceived Dyspnea 0-4      Progression   Progression Continue progressive overload as per policy without signs/symptoms or physical distress.      Resistance Training   Training Prescription Yes    Weight 2 lbs    Reps 10-15             Discharge Exercise Prescription (Final Exercise Prescription Changes):  Exercise Prescription Changes - 03/29/23 1500       Response to Exercise   Blood Pressure (Admit) 110/68    Blood Pressure (Exercise) 120/78    Blood Pressure (Exit) 100/72    Heart Rate (Admit) 78 bpm    Heart Rate (Exercise) 90 bpm    Heart Rate (Exit) 82 bpm    Rating of Perceived Exertion (Exercise) 11    Symptoms None    Comments  Pt's last day in the CRP2 program    Duration Continue with 30 min of aerobic exercise without signs/symptoms of physical distress.    Intensity THRR unchanged      Progression   Progression Continue to progress workloads to maintain intensity without signs/symptoms of physical distress.    Average METs 2.4      Resistance Training   Training Prescription Yes    Weight 3 lbs    Reps 10-15    Time 10 Minutes      Interval Training   Interval Training No      NuStep   Level 1    SPM 92    Minutes 30    METs 2.4             Functional Capacity:  6 Minute Walk     Row Name 02/19/23 1144         6 Minute Walk   Phase Initial     Distance 977 feet     Walk Time 6 minutes     # of Rest Breaks 0     MPH 1.9     METS 1.62     RPE 11  Perceived Dyspnea  1     VO2 Peak 5.7     Symptoms Yes (comment)     Comments SOB, RPD = 1, Bilateral leg pain 2/10     Resting HR 84 bpm     Resting BP 104/74     Resting Oxygen Saturation  91 %     Exercise Oxygen Saturation  during 6 min walk 97 %     Max Ex. HR 91 bpm     Max Ex. BP 110/80     2 Minute Post BP 106/68              Psychological, QOL, Others - Outcomes: PHQ 2/9:    02/19/2023   10:39 AM 07/05/2016    5:30 PM  Depression screen PHQ 2/9  Decreased Interest 0 0  Down, Depressed, Hopeless 0 0  PHQ - 2 Score 0 0  Altered sleeping 2   Tired, decreased energy 3   Change in appetite 0   Feeling bad or failure about yourself  0   Trouble concentrating 0   Moving slowly or fidgety/restless 0   Suicidal thoughts 0   PHQ-9 Score 5   Difficult doing work/chores Not difficult at all     Quality of Life:  Quality of Life - 02/19/23 1524       Quality of Life   Select Quality of Life      Quality of Life Scores   Health/Function Pre 21.93 %    Socioeconomic Pre 29.17 %    Psych/Spiritual Pre 28.29 %    Family Pre 30 %    GLOBAL Pre 25.82 %             Personal Goals: Goals established at  orientation with interventions provided to work toward goal.  Personal Goals and Risk Factors at Admission - 02/19/23 1526       Core Components/Risk Factors/Patient Goals on Admission    Weight Management Yes;Obesity;Weight Loss    Intervention Weight Management: Develop a combined nutrition and exercise program designed to reach desired caloric intake, while maintaining appropriate intake of nutrient and fiber, sodium and fats, and appropriate energy expenditure required for the weight goal.;Weight Management: Provide education and appropriate resources to help participant work on and attain dietary goals.;Weight Management/Obesity: Establish reasonable short term and long term weight goals.;Obesity: Provide education and appropriate resources to help participant work on and attain dietary goals.    Admit Weight 227 lb 4.7 oz (103.1 kg)    Goal Weight: Long Term 200 lb (90.7 kg)    Expected Outcomes Short Term: Continue to assess and modify interventions until short term weight is achieved;Long Term: Adherence to nutrition and physical activity/exercise program aimed toward attainment of established weight goal;Weight Loss: Understanding of general recommendations for a balanced deficit meal plan, which promotes 1-2 lb weight loss per week and includes a negative energy balance of [phone] kcal/d;Understanding recommendations for meals to include 15-35% energy as protein, 25-35% energy from fat, 35-60% energy from carbohydrates, less than 200mg  of dietary cholesterol, 20-35 gm of total fiber daily;Understanding of distribution of calorie intake throughout the day with the consumption of 4-5 meals/snacks    Diabetes Yes    Intervention Provide education about signs/symptoms and action to take for hypo/hyperglycemia.;Provide education about proper nutrition, including hydration, and aerobic/resistive exercise prescription along with prescribed medications to achieve blood glucose in normal ranges: Fasting  glucose 65-99 mg/dL    Expected Outcomes Short Term: Participant verbalizes understanding of the  signs/symptoms and immediate care of hyper/hypoglycemia, proper foot care and importance of medication, aerobic/resistive exercise and nutrition plan for blood glucose control.;Long Term: Attainment of HbA1C < 7%.    Heart Failure Yes    Intervention Provide a combined exercise and nutrition program that is supplemented with education, support and counseling about heart failure. Directed toward relieving symptoms such as shortness of breath, decreased exercise tolerance, and extremity edema.    Expected Outcomes Long term: Adoption of self-care skills and reduction of barriers for early signs and symptoms recognition and intervention leading to self-care maintenance.;Short term: Daily weights obtained and reported for increase. Utilizing diuretic protocols set by physician.;Short term: Attendance in program 2-3 days a week with increased exercise capacity. Reported lower sodium intake. Reported increased fruit and vegetable intake. Reports medication compliance.    Hypertension Yes    Intervention Provide education on lifestyle modifcations including regular physical activity/exercise, weight management, moderate sodium restriction and increased consumption of fresh fruit, vegetables, and low fat dairy, alcohol moderation, and smoking cessation.;Monitor prescription use compliance.    Expected Outcomes Short Term: Continued assessment and intervention until BP is < 140/39mm HG in hypertensive participants. < 130/44mm HG in hypertensive participants with diabetes, heart failure or chronic kidney disease.;Long Term: Maintenance of blood pressure at goal levels.    Lipids Yes    Intervention Provide education and support for participant on nutrition & aerobic/resistive exercise along with prescribed medications to achieve LDL 70mg , HDL >40mg .    Expected Outcomes Short Term: Participant states understanding of  desired cholesterol values and is compliant with medications prescribed. Participant is following exercise prescription and nutrition guidelines.;Long Term: Cholesterol controlled with medications as prescribed, with individualized exercise RX and with personalized nutrition plan. Value goals: LDL < 70mg , HDL > 40 mg.              Personal Goals Discharge:  Goals and Risk Factor Review     Row Name 02/26/23 0744 03/26/23 1752           Core Components/Risk Factors/Patient Goals Review   Personal Goals Review Weight Management/Obesity;Heart Failure;Diabetes;Hypertension;Lipids Weight Management/Obesity;Heart Failure;Diabetes;Hypertension;Lipids      Review Benjamin Erickson started intrnsive cardiac rehab on 02/25/23 and did fair with exercise. Benjamin Erickson reported some shortness of breath and feeling lightheaded. Orthostatic BP's WNL. CBG's WNL Benjamin Erickson is doing well with exercise at intensive cardiac rehab. Vital signs and CBg's and weights  have been stable.      Expected Outcomes Benjamin Erickson will continue to participate in intensive cardiac rehab for exercise, nutrition and lifestyle modifications. Benjamin Erickson will continue to participate in intensive cardiac rehab for exercise, nutrition and lifestyle modifications.               Exercise Goals and Review:  Exercise Goals     Row Name 02/19/23 1521             Exercise Goals   Increase Physical Activity Yes       Intervention Provide advice, education, support and counseling about physical activity/exercise needs.;Develop an individualized exercise prescription for aerobic and resistive training based on initial evaluation findings, risk stratification, comorbidities and participant's personal goals.       Expected Outcomes Short Term: Attend rehab on a regular basis to increase amount of physical activity.;Long Term: Add in home exercise to make exercise part of routine and to increase amount of physical activity.;Long Term: Exercising regularly at  least 3-5 days a week.       Increase Strength and Stamina Yes  Intervention Provide advice, education, support and counseling about physical activity/exercise needs.;Develop an individualized exercise prescription for aerobic and resistive training based on initial evaluation findings, risk stratification, comorbidities and participant's personal goals.       Expected Outcomes Short Term: Increase workloads from initial exercise prescription for resistance, speed, and METs.;Short Term: Perform resistance training exercises routinely during rehab and add in resistance training at home;Long Term: Improve cardiorespiratory fitness, muscular endurance and strength as measured by increased METs and functional capacity ( )       Able to understand and use rate of perceived exertion (RPE) scale Yes       Intervention Provide education and explanation on how to use RPE scale       Expected Outcomes Short Term: Able to use RPE daily in rehab to express subjective intensity level;Long Term:  Able to use RPE to guide intensity level when exercising independently       Knowledge and understanding of Target Heart Rate Range (THRR) Yes       Intervention Provide education and explanation of THRR including how the numbers were predicted and where they are located for reference       Expected Outcomes Short Term: Able to state/look up THRR;Short Term: Able to use daily as guideline for intensity in rehab;Long Term: Able to use THRR to govern intensity when exercising independently       Understanding of Exercise Prescription Yes       Intervention Provide education, explanation, and written materials on patient's individual exercise prescription       Expected Outcomes Short Term: Able to explain program exercise prescription;Long Term: Able to explain home exercise prescription to exercise independently                Exercise Goals Re-Evaluation:  Exercise Goals Re-Evaluation     Row Name 02/25/23  1421 03/27/23 1500           Exercise Goal Re-Evaluation   Exercise Goals Review Increase Physical Activity;Understanding of Exercise Prescription;Increase Strength and Stamina;Knowledge and understanding of Target Heart Rate Range (THRR);Able to understand and use rate of perceived exertion (RPE) scale Increase Physical Activity;Understanding of Exercise Prescription;Increase Strength and Stamina;Knowledge and understanding of Target Heart Rate Range (THRR);Able to understand and use rate of perceived exertion (RPE) scale      Comments Pt's first day in the CRP2 program. Pt understands the exercise Rx, RPE scale, and THRR. Reviewed MET and goals. Pt voices that he has not seen significant imrpovement in his endurance,SOB and has not achieved any significant weight loss.      Expected Outcomes Will continue to monitor patient and progress exercise workloads as tolerated. Will continue to monitor patient and progress exercise workloads as tolerated.               Nutrition & Weight - Outcomes:  Pre Biometrics - 02/19/23 1330       Pre Biometrics   Waist Circumference 48.75 inches    Hip Circumference 45.5 inches    Waist to Hip Ratio 1.07 %    Triceps Skinfold 16 mm    % Body Fat 34.9 %    Grip Strength 35 kg    Flexibility 0 in   cannot reach   Single Leg Stand 3.5 seconds              Nutrition:  Nutrition Therapy & Goals - 03/27/23 1557       Nutrition Therapy   Diet Heart Healthy/Carbohydrate Consistent Diet  Drug/Food Interactions Statins/Certain Fruits      Personal Nutrition Goals   Nutrition Goal Patient to identify strategies for reducing cardiovascular risk by attending the [employer] education and nutrition series weekly.    Personal Goal #2 Patient to improve diet quality by using the plate method as a guide for meal planning to include lean protein/plant protein, fruits, vegetables, whole grains, nonfat dairy as part of a well-balanced diet.    Personal  Goal #3 Patient to reduce sodium to 1500mg  per day    Comments Goals in progress. Benjamin Erickson continues to attend the [employer] and nutrition series regularly. Benjamin Erickson will benefit from participation in intensive cardiac rehab for nutrition, exercise, and lifestyle modification.      Intervention Plan   Intervention Prescribe, educate and counsel regarding individualized specific dietary modifications aiming towards targeted core components such as weight, hypertension, lipid management, diabetes, heart failure and other comorbidities.;Nutrition handout(s) given to patient.    Expected Outcomes Short Term Goal: Understand basic principles of dietary content, such as calories, fat, sodium, cholesterol and nutrients.;Long Term Goal: Adherence to prescribed nutrition plan.             Nutrition Discharge:  Nutrition Assessments - 02/26/23 1526       Rate Your Plate Scores   Pre Score 57             Education Questionnaire Score:  Knowledge Questionnaire Score - 02/19/23 1527       Knowledge Questionnaire Score   Pre Score 20/24            Benjamin Erickson attended 23 exercise and education classes between 02/19/23 to 03/29/23. Benjamin Erickson did fair  with exercise while in attendance. Benjamin Erickson decided not to return to complete the program. Benjamin Headings RN BSN

## 2023-04-05 ENCOUNTER — Encounter (HOSPITAL_COMMUNITY): Payer: PPO

## 2023-04-08 ENCOUNTER — Encounter (HOSPITAL_COMMUNITY): Payer: PPO

## 2023-04-08 DIAGNOSIS — C44319 Basal cell carcinoma of skin of other parts of face: Secondary | ICD-10-CM | POA: Diagnosis not present

## 2023-04-10 ENCOUNTER — Encounter (HOSPITAL_COMMUNITY): Payer: PPO

## 2023-04-12 ENCOUNTER — Encounter (HOSPITAL_COMMUNITY): Payer: PPO

## 2023-04-17 ENCOUNTER — Encounter (HOSPITAL_COMMUNITY): Payer: PPO

## 2023-04-19 ENCOUNTER — Encounter (HOSPITAL_COMMUNITY): Payer: PPO

## 2023-04-22 ENCOUNTER — Encounter (HOSPITAL_COMMUNITY): Payer: PPO

## 2023-04-24 ENCOUNTER — Encounter (HOSPITAL_COMMUNITY): Payer: PPO

## 2023-04-26 ENCOUNTER — Encounter (HOSPITAL_COMMUNITY): Payer: PPO

## 2023-04-29 ENCOUNTER — Encounter (HOSPITAL_COMMUNITY): Payer: PPO

## 2023-05-01 ENCOUNTER — Encounter (HOSPITAL_COMMUNITY): Payer: PPO

## 2023-05-03 ENCOUNTER — Encounter (HOSPITAL_COMMUNITY): Payer: PPO

## 2023-05-09 NOTE — Progress Notes (Signed)
  Cardiology Office Note:   Date:  05/10/2023  ID:  JARRICK HENRICHS, DOB 09/28/1951, MRN [id number] PCP: Farris Has, MD  [facility] Providers Cardiologist:  Rollene Rotunda, MD {  History of Present Illness:   Benjamin Erickson is a 72 y.o. male who presents for evaluation of chest discomfort.   He has CAD WITH 60% mid LAD stenosis, small D1 95% stenosis, Mid circ 80% stenosis and RPDa 80% stenosis.  He had his last cath 01/2023.  He is being managed medically.   He has cardiomyopathy with an EF of 30 - 35%.     He has had many symptoms since he was last seen.  He had taken nitroglycerin several times.  His symptoms are sporadic.  He has episodes of suddenly decreased energy.  Gets episodes of breathlessness.  He might chest discomfort.  He had yellow streaks superior across his skin transiently.  He had an episode while working on his computer of feeling like he was "going to die."  For a few minutes.  He has a lot of shortness of breath at night.  He does not feel like he has any energy.  He is not describing PND or orthopnea.  He is not describing palpitations, presyncope or syncope.  Of note in April he was supposed to be on Ranexa but he has not realized that prescription was sent in.  He does not tolerate isosorbide mononitrate.   ROS: As stated in the HPI and negative for all other systems.  Studies Reviewed:    EKG:   NA   Risk Assessment/Calculations:              Physical Exam:   VS:  BP 130/80 (BP Location: Right Arm, Patient Position: Sitting, Cuff Size: Normal)   Pulse 70   Ht 5\' 5"  (1.651 m)   Wt 229 lb 9.6 oz (104.1 kg)   SpO2 93%   BMI 38.21 kg/m    Wt Readings from Last 3 Encounters:  05/10/23 229 lb 9.6 oz (104.1 kg)  02/19/23 227 lb 4.7 oz (103.1 kg)  02/08/23 223 lb 12.8 oz (101.5 kg)     GEN: Well nourished, well developed in no acute distress NECK: No JVD; No carotid bruits CARDIAC: RRR, no murmurs, rubs, gallops RESPIRATORY:  Clear to  auscultation without rales, wheezing or rhonchi  ABDOMEN: Soft, non-tender, non-distended EXTREMITIES:  No edema; No deformity   ASSESSMENT AND PLAN:   CAD:   His symptoms are somewhat atypical.  However, he does have nonobstructive and small vessel disease.  I am going to try to get him to start taking the Ranexa 500 mg twice daily.  He can come back in about a month for further follow-up and he might need further med titration.  Chronic systolic HF: He has not tolerated spironolactone because of hyperkalemia.  I will try to increase his Entresto to 49/51.  HTN:  This is being managed in the context of treating his CHF   Dyslipidemia: LDL 67.  No change in therapy.  Fatigue: He has strange episodes of sporadic symptoms.  I am can have him wear a 4-week monitor.     Follow up me APP in 1 months.    Signed, Rollene Rotunda, MD

## 2023-05-10 ENCOUNTER — Encounter: Payer: Self-pay | Admitting: Cardiology

## 2023-05-10 ENCOUNTER — Ambulatory Visit: Payer: PPO | Attending: Cardiology | Admitting: Cardiology

## 2023-05-10 VITALS — BP 130/80 | HR 70 | Ht 65.0 in | Wt 229.6 lb

## 2023-05-10 DIAGNOSIS — R002 Palpitations: Secondary | ICD-10-CM

## 2023-05-10 DIAGNOSIS — I5042 Chronic combined systolic (congestive) and diastolic (congestive) heart failure: Secondary | ICD-10-CM | POA: Diagnosis not present

## 2023-05-10 DIAGNOSIS — E785 Hyperlipidemia, unspecified: Secondary | ICD-10-CM

## 2023-05-10 DIAGNOSIS — I10 Essential (primary) hypertension: Secondary | ICD-10-CM | POA: Diagnosis not present

## 2023-05-10 DIAGNOSIS — I25110 Atherosclerotic heart disease of native coronary artery with unstable angina pectoris: Secondary | ICD-10-CM

## 2023-05-10 MED ORDER — RANOLAZINE ER 500 MG PO TB12: 500.0000 mg | ORAL_TABLET | Freq: Two times a day (BID) | ORAL | 1 refills | Status: DC

## 2023-05-10 NOTE — Patient Instructions (Signed)
Medication Instructions:   RESTART RANEXA 500 MG TWICE DAILY  *If you need a refill on your cardiac medications before your next appointment, please call your pharmacy*  Testing/Procedures:  Your physician has recommended that you wear an event monitor. Event monitors are medical devices that record the heart's electrical activity. Doctors most often [place] these monitors to diagnose arrhythmias. Arrhythmias are problems with the speed or rhythm of the heartbeat. The monitor is a small, portable device. You can wear one while you do your normal daily activities. This is usually used to diagnose what is causing palpitations/syncope (passing out).    Follow-Up: At [facility], you and your health needs are our priority.  As part of our continuing mission to provide you with exceptional heart care, we have created designated Provider Care Teams.  These Care Teams include your primary Cardiologist (physician) and Advanced Practice Providers (APPs -  Physician Assistants and Nurse Practitioners) who all work together to provide you with the care you need, when you need it.  We recommend signing up for the patient portal called "MyChart".  Sign up information is provided on this After Visit Summary.  MyChart is used to connect with patients for Virtual Visits (Telemedicine).  Patients are able to view lab/test results, encounter notes, upcoming appointments, etc.  Non-urgent messages can be sent to your provider as well.   To learn more about what you can do with MyChart, go to [email or web address].    Your next appointment:   3 month(s)  Provider:   ANY APP

## 2023-05-14 DIAGNOSIS — R002 Palpitations: Secondary | ICD-10-CM | POA: Diagnosis not present

## 2023-05-31 ENCOUNTER — Other Ambulatory Visit: Payer: Self-pay | Admitting: Student

## 2023-06-11 ENCOUNTER — Ambulatory Visit: Payer: PPO | Attending: Cardiology

## 2023-06-11 DIAGNOSIS — R002 Palpitations: Secondary | ICD-10-CM

## 2023-07-14 ENCOUNTER — Other Ambulatory Visit: Payer: Self-pay | Admitting: Physician Assistant

## 2023-07-20 ENCOUNTER — Other Ambulatory Visit: Payer: Self-pay | Admitting: Student

## 2023-08-13 NOTE — Progress Notes (Unsigned)
Cardiology Clinic Note   Patient Name: Benjamin Erickson Date of Encounter: 08/15/2023  Primary Care Provider:  Farris Has, MD Primary Cardiologist:  Rollene Rotunda, MD  Patient Profile    80 male with hx of CAD WITH 60% mid LAD stenosis, small D1 95% stenosis, Mid circ 80% stenosis and RPDa 80% stenosis. He had his last cath 01/2023. He is being managed medically. He has cardiomyopathy with an EF of 30 - 35%.  When last seen by Dr. Antoine Poche was started on Ranexa 500 mg BID for chronic angina. He has not tolerated spironolactone because of hyperkalemia. Entresto was titrated up to 49/51 mg BID. Two week Zio was placed with no arrhythmias.   Past Medical History    Past Medical History:  Diagnosis Date   Bigeminy    DM (diabetes mellitus) (HCC)    Frequent PVCs    Hyperlipidemia    Hypertension    Nocturia    Past Surgical History:  Procedure Laterality Date   CARDIAC CATHETERIZATION     HERNIA REPAIR     Inguinal    LEFT HEART CATH AND CORONARY ANGIOGRAPHY N/A 10/16/2022   Procedure: LEFT HEART CATH AND CORONARY ANGIOGRAPHY;  Surgeon: [place], Peter M, MD;  Location: [facility] INVASIVE CV LAB;  Service: Cardiovascular;  Laterality: N/A;   LEFT HEART CATH AND CORONARY ANGIOGRAPHY N/A 01/28/2023   Procedure: LEFT HEART CATH AND CORONARY ANGIOGRAPHY;  Surgeon: Runell Gess, MD;  Location: [facility] INVASIVE CV LAB;  Service: Cardiovascular;  Laterality: N/A;    Allergies  Allergies  Allergen Reactions   Imdur [Isosorbide Nitrate]    Iodinated Contrast Media Hives   Lidocaine Hives   Ranexa [Ranolazine Er] Hives   Sulfa Antibiotics Hives    History of Present Illness    Mr. Bahl returns today for ongoing assessment and management of chronic systolic heart failure, EF 30 to 35%, CAD, hyperlipidemia and hypertension.  Since being seen last the patient has stopped Ranexa as this caused a rash, his Sherryll Burger was decreased to 2426 mg due to dizziness and hypotension.  He has not  been able to afford Praluent, and Crestor has been changed to 1 tablet twice a week.  He is also just continued spironolactone due to hyperkalemia.  Since decreased dose of Entresto the patient has been feeling better.  His breathing status has improved.  His energy level has improved.  He states back in June he felt very weak, having chest pain, with [place] heart association class III symptoms.  Now he is improved significantly with [place] heart association class I-II symptoms.  He is not very active currently, but denies palpitations, dyspnea on exertion persistently, lower extremity edema, or dizziness.  He has been medically compliant.  His weight has been consistent for the last 3 months.  Currently at 228 pounds.  Home Medications    Current Outpatient Medications  Medication Sig Dispense Refill   acetaminophen (TYLENOL) 325 MG tablet Take 650 mg by mouth as needed for moderate pain.     aspirin EC 81 MG tablet Take 81 mg by mouth daily. Swallow whole.     calcium elemental as carbonate (TUMS ULTRA 1000) 400 MG chewable tablet Chew 2,000-3,000 mg by mouth as needed for heartburn.     carvedilol (COREG) 6.25 MG tablet TAKE 1 TABLET BY MOUTH TWICE A DAY (Patient taking differently: Take 6.25 mg by mouth 2 (two) times daily with a meal.) 180 tablet 3   dapagliflozin propanediol (FARXIGA) 10 MG  TABS tablet Take 1 tablet (10 mg total) by mouth daily before breakfast. 90 tablet 3   furosemide (LASIX) 20 MG tablet TAKE 1 TABLET BY MOUTH EVERY DAY 30 tablet 1   metFORMIN (GLUCOPHAGE) 500 MG tablet Take 1 tablet (500 mg total) by mouth 2 (two) times daily.     rosuvastatin (CRESTOR) 20 MG tablet TAKE 1 TABLET BY MOUTH EVERYDAY AT BEDTIME (Patient taking differently: Take 20 mg by mouth 2 (two) times a week.) 90 tablet 1   sacubitril-valsartan (ENTRESTO) 24-26 MG Take 1 tablet by mouth 2 (two) times daily. 180 tablet 3   sodium zirconium cyclosilicate (LOKELMA) 10 g PACK packet Take 10 g by mouth  daily. 3 packet 0   spironolactone (ALDACTONE) 25 MG tablet TAKE 0.5 TABLETS (12.5 MG TOTAL) BY MOUTH EVERY OTHER DAY. 90 tablet 2   tamsulosin (FLOMAX) 0.4 MG CAPS capsule Take 0.4 mg by mouth daily.     nitroGLYCERIN (NITROSTAT) 0.4 MG SL tablet Place 1 tablet (0.4 mg total) under the tongue every 5 (five) minutes as needed for chest pain. 90 tablet 3   No current facility-administered medications for this visit.     Family History    Family History  Problem Relation Age of Onset   Skin cancer Mother    Throat cancer Father    Heart failure Father    CAD Father    He indicated that his mother is deceased. He indicated that his father is deceased. He indicated that both of his sisters are alive. He indicated that his brother is alive. He indicated that his maternal grandmother is deceased. He indicated that his maternal grandfather is deceased. He indicated that his paternal grandmother is deceased. He indicated that his paternal grandfather is deceased.  Social History    Social History   Socioeconomic History   Marital status: Married    Spouse name: Not on file   Number of children: Not on file   Years of education: 13   Highest education level: Some college, no degree  Occupational History   Occupation: Retired  Tobacco Use   Smoking status: Former    Types: Cigarettes   Smokeless tobacco: Never   Tobacco comments:    Quit 40 years ago  Substance and Sexual Activity   Alcohol use: Not Currently   Drug use: Not Currently   Sexual activity: Yes    Partners: Female  Other Topics Concern   Not on file  Social History Narrative   Not on file   Social Determinants of Health   Financial Resource Strain: Not on file  Food Insecurity: No Food Insecurity (01/26/2023)   Hunger Vital Sign    Worried About Running Out of Food in the Last Year: Never true    Ran Out of Food in the Last Year: Never true  Transportation Needs: No Transportation Needs (01/26/2023)   PRAPARE -  Administrator, Civil Service (Medical): No    Lack of Transportation (Non-Medical): No  Physical Activity: Not on file  Stress: Not on file  Social Connections: Not on file  Intimate Partner Violence: Not At Risk (01/26/2023)   Humiliation, Afraid, Rape, and Kick questionnaire    Fear of Current or Ex-Partner: No    Emotionally Abused: No    Physically Abused: No    Sexually Abused: No     Review of Systems    General:  No chills, fever, night sweats or weight changes.  Cardiovascular:  No chest pain,  dyspnea on exertion, edema, orthopnea, palpitations, paroxysmal nocturnal dyspnea. Dermatological: No rash, lesions/masses Respiratory: No cough, dyspnea Urologic: No hematuria, dysuria Abdominal:   No nausea, vomiting, diarrhea, bright red blood per rectum, melena, or hematemesis Neurologic:  No visual changes, wkns, changes in mental status. All other systems reviewed and are otherwise negative except as noted above.       Physical Exam    VS:  BP 100/60   Pulse 73   Ht 5\' 7"  (1.702 m)   Wt 228 lb 6.4 oz (103.6 kg)   SpO2 95%   BMI 35.77 kg/m  , BMI Body mass index is 35.77 kg/m.     GEN: Well nourished, well developed, in no acute distress. HEENT: normal. Neck: Supple, no JVD, carotid bruits, or masses. Cardiac: RRR, distant heart sounds unable to auscultate murmurs, r no ubs, or gallops. No clubbing, cyanosis, edema.  Radials/DP/PT 2+ and equal bilaterally.  Respiratory:  Respirations regular and unlabored, clear to auscultation bilaterally. GI: Soft, nontender, nondistended, BS + x 4.  Central obesity MS: no deformity or atrophy. Skin: warm and dry, no rash. Neuro:  Strength and sensation are intact. Psych: Normal affect.      Lab Results  Component Value Date   WBC 11.4 (H) 01/28/2023   HGB 14.9 01/28/2023   HCT 45.7 01/28/2023   MCV 95.2 01/28/2023   PLT 339 01/28/2023   Lab Results  Component Value Date   CREATININE 1.14 03/26/2023   BUN 23  03/26/2023   NA 135 03/26/2023   K 4.8 03/26/2023   CL 97 03/26/2023   CO2 23 03/26/2023   Lab Results  Component Value Date   ALT 20 01/26/2023   AST 16 01/26/2023   ALKPHOS 41 01/26/2023   BILITOT 0.6 01/26/2023   Lab Results  Component Value Date   CHOL 143 01/27/2023   HDL 32 (L) 01/27/2023   LDLCALC 57 01/27/2023   TRIG 272 (H) 01/27/2023   CHOLHDL 4.5 01/27/2023    Lab Results  Component Value Date   HGBA1C 6.7 (H) 01/28/2023     Review of Prior Studies    Echocardiogram 01/27/2023 1. Left ventricular ejection fraction, by estimation, is 50%. The left  ventricle has low normal function. The left ventricle has no regional wall  motion abnormalities.   2. Right ventricular systolic function is normal. The right ventricular  size is normal.   3. Compared side by side to 01/15/23 echo. LVEF has improved since prior  study.   FINDINGS   Left Ventricle: Left ventricular ejection fraction, by estimation, is  50%. The left ventricle has low normal function. The left ventricle has no  regional wall motion abnormalities. Definity contrast agent was given IV  to delineate the left ventricular  endocardial borders.   Right Ventricle: The right ventricular size is normal. Right vetricular  wall thickness was not well visualized. Right ventricular systolic  function is normal.   LHC 01/28/2023 Mid LAD lesion is 60% stenosed.   1st Diag lesion is 95% stenosed.   RPDA lesion is 80% stenosed.   Mid Cx lesion is 80% stenosed.   Assessment & Plan   1.  HFrEF: Most recent echocardiogram 01/27/2023 with a EF of 30 to 35%.  The patient was unable to tolerate increased doses Entresto at 4951 mg twice daily and therefore this was reduced back to 2426 mg twice daily.  Spironolactone was discontinued due to hyperkalemia.  He remains on Farxiga, Lasix 20 mg daily, weight has  been consistent, I see no evidence of volume overload today.  I will repeat his echocardiogram for comparison  of prior echo in March 2024 to help with medication management.  I will also check a BMET, and a CBC today.  Echocardiogram will be done at [facility] as this is most convenient for him.  2.  CAD: Was having chronic angina in June 2024 and Ranexa was added.  He was unable to tolerate this causing a rash.  This has been added to his medication allergies.  Continue secondary prevention with blood pressure control, lipid management, purposeful exercise.  3.  Hypercholesterolemia: Unable to afford Praluent.  Referring him to pharmacy to help with medication assistance and alternatives.  He is unable to tolerate Crestor daily and is now taking 20 mg twice week only.  I am ordering follow-up lipid panel.         Signed, Bettey Mare. Liborio Nixon, ANP, AACC   08/15/2023 12:11 PM      Office [phone] Fax [phone]  Notice: This dictation was prepared with Dragon dictation along with smaller phrase technology. Any transcriptional errors that result from this process are unintentional and may not be corrected upon review.

## 2023-08-15 ENCOUNTER — Ambulatory Visit: Payer: PPO | Admitting: Student

## 2023-08-15 ENCOUNTER — Encounter: Payer: Self-pay | Admitting: Adult Health

## 2023-08-15 ENCOUNTER — Ambulatory Visit: Payer: PPO | Attending: Student | Admitting: Adult Health

## 2023-08-15 VITALS — BP 100/60 | HR 73 | Ht 67.0 in | Wt 228.4 lb

## 2023-08-15 DIAGNOSIS — E785 Hyperlipidemia, unspecified: Secondary | ICD-10-CM | POA: Diagnosis not present

## 2023-08-15 DIAGNOSIS — I5022 Chronic systolic (congestive) heart failure: Secondary | ICD-10-CM | POA: Diagnosis not present

## 2023-08-15 DIAGNOSIS — I10 Essential (primary) hypertension: Secondary | ICD-10-CM

## 2023-08-15 NOTE — Patient Instructions (Signed)
Medication Instructions:  NO CHANGES    Lab Work: FASTING LIPID PANEL CMET CBC   Testing/Procedures: AT [facility]  Your physician has requested that you have an echocardiogram. Echocardiography is a painless test that uses sound waves to create images of your heart. It provides your doctor with information about the size and shape of your heart and how well your heart's chambers and valves are working. This procedure takes approximately one hour. There are no restrictions for this procedure. Please do NOT wear cologne, perfume, aftershave, or lotions (deodorant is allowed). Please arrive 15 minutes prior to your appointment time.    Follow-Up: At [facility], you and your health needs are our priority.  As part of our continuing mission to provide you with exceptional heart care, we have created designated Provider Care Teams.  These Care Teams include your primary Cardiologist (physician) and Advanced Practice Providers (APPs -  Physician Assistants and Nurse Practitioners) who all work together to provide you with the care you need, when you need it.  We recommend signing up for the patient portal called "MyChart".  Sign up information is provided on this After Visit Summary.  MyChart is used to connect with patients for Virtual Visits (Telemedicine).  Patients are able to view lab/test results, encounter notes, upcoming appointments, etc.  Non-urgent messages can be sent to your provider as well.   To learn more about what you can do with MyChart, go to [email or web address].    Your next appointment:   3 month(s)  Provider:   Rollene Rotunda, MD

## 2023-08-19 DIAGNOSIS — I700 Atherosclerosis of aorta: Secondary | ICD-10-CM | POA: Diagnosis not present

## 2023-08-19 DIAGNOSIS — R0602 Shortness of breath: Secondary | ICD-10-CM | POA: Diagnosis not present

## 2023-08-19 DIAGNOSIS — E1169 Type 2 diabetes mellitus with other specified complication: Secondary | ICD-10-CM | POA: Diagnosis not present

## 2023-08-19 DIAGNOSIS — I5022 Chronic systolic (congestive) heart failure: Secondary | ICD-10-CM | POA: Diagnosis not present

## 2023-08-19 DIAGNOSIS — Z0000 Encounter for general adult medical examination without abnormal findings: Secondary | ICD-10-CM | POA: Diagnosis not present

## 2023-08-19 DIAGNOSIS — I110 Hypertensive heart disease with heart failure: Secondary | ICD-10-CM | POA: Diagnosis not present

## 2023-08-19 DIAGNOSIS — E119 Type 2 diabetes mellitus without complications: Secondary | ICD-10-CM | POA: Diagnosis not present

## 2023-08-19 DIAGNOSIS — I10 Essential (primary) hypertension: Secondary | ICD-10-CM | POA: Diagnosis not present

## 2023-08-19 DIAGNOSIS — E785 Hyperlipidemia, unspecified: Secondary | ICD-10-CM | POA: Diagnosis not present

## 2023-08-20 DIAGNOSIS — R972 Elevated prostate specific antigen [PSA]: Secondary | ICD-10-CM | POA: Diagnosis not present

## 2023-08-26 ENCOUNTER — Ambulatory Visit (HOSPITAL_BASED_OUTPATIENT_CLINIC_OR_DEPARTMENT_OTHER): Payer: PPO

## 2023-08-26 DIAGNOSIS — I10 Essential (primary) hypertension: Secondary | ICD-10-CM

## 2023-08-26 DIAGNOSIS — I5022 Chronic systolic (congestive) heart failure: Secondary | ICD-10-CM

## 2023-08-26 DIAGNOSIS — E785 Hyperlipidemia, unspecified: Secondary | ICD-10-CM | POA: Diagnosis not present

## 2023-08-26 LAB — ECHOCARDIOGRAM COMPLETE
Area-P 1/2: 3.48 cm2
S' Lateral: 3.77 cm

## 2023-08-26 MED ORDER — PERFLUTREN LIPID MICROSPHERE
1.0000 mL | INTRAVENOUS | Status: AC | PRN
  Administered 2023-08-26: 2 mL via INTRAVENOUS

## 2023-08-29 ENCOUNTER — Telehealth: Payer: Self-pay

## 2023-08-29 ENCOUNTER — Ambulatory Visit: Payer: PPO

## 2023-08-29 NOTE — Telephone Encounter (Addendum)
Results seen by patient via Mychart.----- Message from Joni Reining sent at 08/27/2023  5:13 PM EDT ----- I have reviewed her echocardiogram report. Normal heart pumping function Normal heart valves. Good report.

## 2023-09-03 DIAGNOSIS — I10 Essential (primary) hypertension: Secondary | ICD-10-CM | POA: Diagnosis not present

## 2023-09-03 DIAGNOSIS — E785 Hyperlipidemia, unspecified: Secondary | ICD-10-CM | POA: Diagnosis not present

## 2023-09-04 LAB — COMPREHENSIVE METABOLIC PANEL WITH GFR
ALT: 19 IU/L (ref 0–44)
AST: 16 IU/L (ref 0–40)
Albumin: 4.4 g/dL (ref 3.8–4.8)
Alkaline Phosphatase: 46 IU/L (ref 44–121)
BUN/Creatinine Ratio: 20 (ref 10–24)
BUN: 20 mg/dL (ref 8–27)
Bilirubin Total: 0.4 mg/dL (ref 0.0–1.2)
CO2: 24 mmol/L (ref 20–29)
Calcium: 9.4 mg/dL (ref 8.6–10.2)
Chloride: 101 mmol/L (ref 96–106)
Creatinine, Ser: 1.02 mg/dL (ref 0.76–1.27)
Globulin, Total: 2.3 g/dL (ref 1.5–4.5)
Glucose: 118 mg/dL — ABNORMAL HIGH (ref 70–99)
Potassium: 5 mmol/L (ref 3.5–5.2)
Sodium: 139 mmol/L (ref 134–144)
Total Protein: 6.7 g/dL (ref 6.0–8.5)
eGFR: 79 mL/min/1.73

## 2023-09-04 LAB — LIPID PANEL
Chol/HDL Ratio: 4.7 {ratio} (ref 0.0–5.0)
Cholesterol, Total: 187 mg/dL (ref 100–199)
HDL: 40 mg/dL (ref >39)
LDL Chol Calc (NIH): 109 mg/dL — ABNORMAL HIGH (ref 0–99)
Triglycerides: 219 mg/dL — ABNORMAL HIGH (ref 0–149)
VLDL Cholesterol Cal: 38 mg/dL (ref 5–40)

## 2023-09-04 LAB — CBC
Hematocrit: 48.9 % (ref 37.5–51.0)
Hemoglobin: 15.8 g/dL (ref 13.0–17.7)
MCH: 31 pg (ref 26.6–33.0)
MCHC: 32.3 g/dL (ref 31.5–35.7)
MCV: 96 fL (ref 79–97)
Platelets: 324 10*3/uL (ref 150–450)
RBC: 5.1 x10E6/uL (ref 4.14–5.80)
RDW: 13 % (ref 11.6–15.4)
WBC: 9.4 10*3/uL (ref 3.4–10.8)

## 2023-09-05 ENCOUNTER — Other Ambulatory Visit: Payer: Self-pay | Admitting: Physician Assistant

## 2023-10-01 ENCOUNTER — Ambulatory Visit: Payer: PPO

## 2023-10-09 ENCOUNTER — Encounter: Payer: Self-pay | Admitting: Cardiology

## 2023-10-21 NOTE — Progress Notes (Unsigned)
Cardiology Office Note:   Date:  10/24/2023  ID:  Benjamin Erickson, DOB [birth year], MRN [id number] PCP: Farris Has, MD  [facility] Providers Cardiologist:  Rollene Rotunda, MD {  History of Present Illness:   Benjamin Erickson is a 72 y.o. male  who presents for evaluation of chest discomfort.   He has CAD WITH 60% mid LAD stenosis, small D1 95% stenosis, Mid circ 80% stenosis and RPDa 80% stenosis.  He had his last cath 01/2023.  He is being managed medically.   He has cardiomyopathy with an EF of 30 - 35%.     Since I last saw him he had an echo with EF now 55 - 60%.  He was breathing great when he was seen in September in our office but he is got a little short of breath since then.  It turns out he does use a fair amount of salt.  He drinks 6 to 8 glasses of water every day.  Take an extra diuretic if he thinks he is retaining fluid.  He has been getting more short of breath with activities and has had occasional shortness of breath at night lying flat although he is not sleeping upright.  His weights have crept up a little bit.  He is not having any lower extremity swelling.  He denies any cough fevers or chills.  He is not having any chest pressure, neck or arm discomfort.  His saturations stayed in the mid 90s when he feels like he is short of breath.  ROS: As stated in the HPI and negative for all other systems.  Studies Reviewed:    EKG:   EKG Interpretation Date/Time:  Thursday October 24 2023 11:52:50 EST Ventricular Rate:  63 PR Interval:  240 QRS Duration:  116 QT Interval:  404 QTC Calculation: 413 R Axis:   -39  Text Interpretation: Sinus rhythm with 1st degree A-V block Left axis deviation Low voltage QRS Nonspecific T wave abnormality When compared with ECG of 27-Jan-2023 08:40, Abberant conduction is no longer Present Nonspecific T wave abnormality, worse in Lateral leads Confirmed by Rollene Rotunda ([id number]) on 10/24/2023 12:11:25 PM   Risk  Assessment/Calculations:              Physical Exam:   VS:  BP 112/76   Pulse 67   Ht 5\' 9"  (1.753 m)   Wt 228 lb (103.4 kg)   SpO2 95%   BMI 33.67 kg/m    Wt Readings from Last 3 Encounters:  10/24/23 228 lb (103.4 kg)  08/15/23 228 lb 6.4 oz (103.6 kg)  05/10/23 229 lb 9.6 oz (104.1 kg)     GEN: Well nourished, well developed in no acute distress NECK: No JVD; No carotid bruits CARDIAC: RRR, no murmurs, rubs, gallops RESPIRATORY:  Clear to auscultation without rales, wheezing or rhonchi  ABDOMEN: Soft, non-tender, non-distended EXTREMITIES:  No edema; No deformity    ASSESSMENT AND PLAN:   CAD:    The patient has no new sypmtoms.  No further cardiovascular testing is indicated.  We will continue with aggressive risk reduction and meds as listed.   Chronic systolic HF: He previously had dizzy on Entresto at a higher dose and it was reduced but I would like to try this again to see if he tolerates it.  Otherwise meds will remain as listed.  I think his dyspnea is probably some now diastolic dysfunction as his systolic function seems to have improved.  He needs  salt restriction and we will educate him.  He needs 48 ounce fluid restriction.    HTN:   This is being managed in the context of treating his CHF  CAD: He had nonobstructive and small vessel coronary disease managed medically.  He needs continued risk reduction.   Dyslipidemia: His LDL is up to 109.  However, he was having trouble getting his Praluent and he just started it back.  We will check a lipid profile in 3 months.  Goal LDL is 70 at the highest.        Follow up with Joni Reining DNP  in 3 months.   Signed, Rollene Rotunda, MD

## 2023-10-24 ENCOUNTER — Encounter: Payer: Self-pay | Admitting: Cardiology

## 2023-10-24 ENCOUNTER — Ambulatory Visit: Payer: PPO | Attending: Cardiology | Admitting: Cardiology

## 2023-10-24 VITALS — BP 112/76 | HR 67 | Ht 69.0 in | Wt 228.0 lb

## 2023-10-24 DIAGNOSIS — I25110 Atherosclerotic heart disease of native coronary artery with unstable angina pectoris: Secondary | ICD-10-CM | POA: Diagnosis not present

## 2023-10-24 DIAGNOSIS — I10 Essential (primary) hypertension: Secondary | ICD-10-CM

## 2023-10-24 DIAGNOSIS — E785 Hyperlipidemia, unspecified: Secondary | ICD-10-CM | POA: Diagnosis not present

## 2023-10-24 DIAGNOSIS — I5042 Chronic combined systolic (congestive) and diastolic (congestive) heart failure: Secondary | ICD-10-CM

## 2023-10-24 MED ORDER — SACUBITRIL-VALSARTAN 49-51 MG PO TABS: 1.0000 | ORAL_TABLET | Freq: Two times a day (BID) | ORAL | 3 refills | Status: DC

## 2023-10-24 NOTE — Patient Instructions (Addendum)
Medication Instructions:  Stop current dose of entresto and start sacubitril-valsartan 49/51 one tablet twice per day. New script sent.   *If you need a refill on your cardiac medications before your next appointment, please call your pharmacy*  Labs:  Fasting Lipid panel in 3 months.   Follow-Up: At [facility], you and your health needs are our priority.  As part of our continuing mission to provide you with exceptional heart care, we have created designated Provider Care Teams.  These Care Teams include your primary Cardiologist (physician) and Advanced Practice Providers (APPs -  Physician Assistants and Nurse Practitioners) who all work together to provide you with the care you need, when you need it.  Your next appointment:   3 month(s)  Provider:   Joni Reining, DNP, ANP      Other Instructions Salty 6 hand out provided.

## 2024-01-07 DIAGNOSIS — E785 Hyperlipidemia, unspecified: Secondary | ICD-10-CM | POA: Diagnosis not present

## 2024-01-08 LAB — LIPID PANEL
Chol/HDL Ratio: 3.8 {ratio} (ref 0.0–5.0)
Cholesterol, Total: 150 mg/dL (ref 100–199)
HDL: 40 mg/dL (ref >39)
LDL Chol Calc (NIH): 64 mg/dL (ref 0–99)
Triglycerides: 289 mg/dL — ABNORMAL HIGH (ref 0–149)
VLDL Cholesterol Cal: 46 mg/dL — ABNORMAL HIGH (ref 5–40)

## 2024-01-13 ENCOUNTER — Other Ambulatory Visit: Payer: Self-pay | Admitting: Student

## 2024-01-13 ENCOUNTER — Other Ambulatory Visit: Payer: Self-pay | Admitting: Cardiology

## 2024-01-23 ENCOUNTER — Ambulatory Visit: Payer: PPO | Admitting: Adult Health

## 2024-02-02 ENCOUNTER — Other Ambulatory Visit: Payer: Self-pay | Admitting: Cardiology

## 2024-02-06 ENCOUNTER — Other Ambulatory Visit: Payer: Self-pay | Admitting: Physician Assistant

## 2024-02-06 ENCOUNTER — Other Ambulatory Visit: Payer: Self-pay

## 2024-02-06 MED ORDER — NITROGLYCERIN 0.4 MG SL SUBL: 0.4000 mg | SUBLINGUAL_TABLET | SUBLINGUAL | 2 refills | Status: DC | PRN

## 2024-02-14 ENCOUNTER — Other Ambulatory Visit: Payer: Self-pay | Admitting: Cardiology

## 2024-02-23 NOTE — Progress Notes (Unsigned)
 Cardiology Office Note:  .   Date:  02/24/2024  ID:  Benjamin Erickson, DOB [birth year], MRN [id number] PCP: Farris Has, MD  [facility] Providers Cardiologist:  Rollene Rotunda, MD {  History of Present Illness: .   TAHJE Erickson is a 73 y.o. 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  Patient has had recent cardiac catheterization in March 2024 that did not note any significant change in coronary anatomy from previous cardiac catheterization in November 2023.  He had 60% stenosis in mid LAD, 95% stenosis first diagonal, 80% stenosis in the RPDA and mid LCx.  Medical management recommended. Has EF as low as 30 to 35% but since March 2024 it has since normalized. At his last office visit with Dr. Antoine Poche in December 2024 he noted increased shortness of breath with activity and some orthopnea, weight gain.  No chest pain.   Today he is here for 67-month follow-up.  He reports that he has had progressive complaints of shortness of breath/fatigue but no significant issues with orthopnea or peripheral edema.  These complaints arise somewhat erratically and sometimes he has good days and the days are pretty bad but he does note significant decrease in activity intolerance.  Symptoms can occur at rest or with exertion.  No chest pain.  These symptoms seem to have been progressive since around December 2024.   ROS: Denies: Chest pain, orthopnea, peripheral edema, palpitations, lightheadedness.    Studies Reviewed: .   Cardiac Studies & Procedures   ______________________________________________________________________________________________ CARDIAC CATHETERIZATION  CARDIAC CATHETERIZATION 01/28/2023  Narrative Images from the original result were not included.    Mid LAD lesion is 60% stenosed.   1st Diag lesion is 95% stenosed.   RPDA lesion is 80% stenosed.   Mid Cx lesion is 80%  stenosed.  Benjamin Erickson is a 73 y.o. male   [id number] LOCATION:  FACILITY: [facility] PHYSICIAN: Nanetta Batty, M.D. [birth year]   DATE OF PROCEDURE:  01/28/2023  DATE OF DISCHARGE:     CARDIAC CATHETERIZATION    History obtained from chart review.SAATVIK Erickson is a 73 y.o. male with CAD (cardiac catheterization performed 1 year ago by Dr. [place] revealed noncritical CAD, medical therapy was recommended)., chronic HFrEF, hypertension, hyperlipidemia who was admitted on 01/26/2023 for non-STEMI.  Impression [name] has no change in his coronary anatomy compared to his cardiac catheterization performed by Dr. [place] last year.  He has nonobstructive CAD.  Medical therapy will be recommended.  The sheath was removed and a TR band was placed on the right wrist to achieve patent hemostasis.  The patient left lab in stable condition.  Nanetta Batty. MD, [facility] 01/28/2023 2:45 PM  Findings Coronary Findings Diagnostic  Dominance: Right  Left Main Vessel is normal in caliber. Vessel is angiographically normal.  Left Anterior Descending Mid LAD lesion is 60% stenosed.  First Diagonal Branch Vessel is small in size. 1st Diag lesion is 95% stenosed.  Left Circumflex Vessel is normal in caliber. The vessel is moderately ectatic. Mid Cx lesion is 80% stenosed.  First Obtuse Marginal Branch Vessel is large in size.  Right Coronary Artery Vessel is large. There is mild diffuse disease throughout the vessel. The vessel is severely ectatic.  Right Posterior Descending Artery RPDA lesion is 80% stenosed.  Intervention  No interventions have been documented.   CARDIAC CATHETERIZATION  CARDIAC CATHETERIZATION 10/16/2022  Narrative   Mid LAD lesion is 60% stenosed.  1st Diag lesion is 95% stenosed.   RPDA lesion is 80% stenosed.   LV end diastolic pressure is normal.  Diffuse coronary ectasia. No significant obstruction in the major epicardial vessels. There is  obstructive disease in a very small diagonal branch and the mid to distal PDA Normal LVEDP  Plan: medical therapy.  Findings Coronary Findings Diagnostic  Dominance: Right  Left Main Vessel was injected. Vessel is normal in caliber. Vessel is angiographically normal.  Left Anterior Descending Mid LAD lesion is 60% stenosed.  First Diagonal Branch Vessel is small in size. 1st Diag lesion is 95% stenosed.  Left Circumflex Vessel was injected. Vessel is normal in caliber. The vessel is moderately ectatic.  First Obtuse Marginal Branch Vessel is large in size.  Right Coronary Artery Vessel was injected. Vessel is large. There is mild diffuse disease throughout the vessel. The vessel is severely ectatic.  Right Posterior Descending Artery RPDA lesion is 80% stenosed.  Intervention  No interventions have been documented.   STRESS TESTS  NM PET CT CARDIAC PERFUSION MULTI W/ABSOLUTE BLOODFLOW 09/18/2022  Narrative   LV perfusion is abnormal. There is no evidence of ischemia. There is evidence of infarction. Defect 1: There is a small defect with mild reduction in uptake present in the apical to mid inferior location(s) that is fixed. There is abnormal wall motion in the defect area. Consistent with infarction.   Rest left ventricular function is abnormal. Rest global function is severely reduced. Rest EF: 22 %. Stress left ventricular function is abnormal. Stress global function is severely reduced. Stress EF: 13 %. End diastolic cavity size is normal. End systolic cavity size is moderately enlarged.   Myocardial blood flow was computed to be 0.50ml/g/min at rest and 1.26ml/g/min at stress. Global myocardial blood flow reserve was 1.85 and was mildly abnormal.   Coronary calcium was present on the attenuation correction CT images. Severe coronary calcifications were present. Coronary calcifications were present in the left anterior descending artery, left circumflex artery and  right coronary artery distribution(s).   Findings are consistent with prior myocardial infarction. The study is high risk.   Small fixed inferior perfusion defect consistent with infarct.  However, systolic function is severely reduced, and in setting of severe coronary calcifications and decrease in EF with stress and decreased myocardial blood flow reserve, recommend cardiac catheterization to evaluate for severe multivessel CAD.  If no obstructive coronary disease on cath, suspect decreased myocardial blood flow is due to microvascular disease.  Would also recommend echocardiogram to evaluate systolic function.   Electronically signed by Epifanio Lesches, MD  CLINICAL DATA:  This over-read does not include interpretation of cardiac or coronary anatomy or pathology. The Cardiac PET CT interpretation by the cardiologist is attached.  COMPARISON:  03/24/2020 cardiac CT  FINDINGS: Vascular: Aortic atherosclerosis.  Mediastinum/Nodes: No imaged thoracic adenopathy.  Lungs/Pleura: No pleural fluid.  Mild motion degradation inferiorly.  Upper Abdomen: Right hemidiaphragm elevation. 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 Normal imaged portions of the spleen, stomach, pancreas, gallbladder, adrenal glands.  Musculoskeletal: No acute osseous abnormality.  IMPRESSION: No acute findings in the imaged extracardiac chest.  Aortic Atherosclerosis (ICD10-I70.0).   Electronically Signed By: Jeronimo Greaves M.D. On: 09/18/2022 12:06   ECHOCARDIOGRAM  ECHOCARDIOGRAM COMPLETE 08/26/2023  Narrative ECHOCARDIOGRAM REPORT    Patient Name:   LEX LINHARES Date of Exam: 08/26/2023 Medical Rec #:  [id number]         Height:       67.0 in Accession #:  [id number]        Weight:       228.4 lb Date of Birth:  13-Jan-1951         BSA:          2.140 m Patient Age:    71 years          BP:           136/59 mmHg Patient Gender: M                 HR:           54 bpm. Exam Location:   Outpatient  Procedure: 3D Echo, 2D Echo, Color Doppler, Cardiac Doppler and Intracardiac Opacification Agent  Indications:    CHF  History:        Patient has prior history of Echocardiogram examinations, most recent 01/27/2023. CAD and Previous Myocardial Infarction, Signs/Symptoms:Shortness of Breath; Risk Factors:Dyslipidemia, Hypertension, Diabetes and Former Smoker.  Sonographer:    Jeryl Columbia RDCS Referring Phys: [id number] [facility]   Sonographer Comments: 30 minutes post definity 120/68 IMPRESSIONS   1. Left ventricular ejection fraction, by estimation, is 55 to 60%. The left ventricle has normal function. The left ventricle has no regional wall motion abnormalities. There is mild left ventricular hypertrophy. 2. Right ventricular systolic function is low normal. The right ventricular size is normal. 3. The mitral valve is normal in structure. Trivial mitral valve regurgitation. 4. The aortic valve is tricuspid. Aortic valve regurgitation is not visualized. Aortic valve sclerosis/calcification is present, without any evidence of aortic stenosis. 5. The inferior vena cava is normal in size with greater than 50% respiratory variability, suggesting right atrial pressure of 3 mmHg.  FINDINGS Left Ventricle: Left ventricular ejection fraction, by estimation, is 55 to 60%. The left ventricle has normal function. The left ventricle has no regional wall motion abnormalities. Definity contrast agent was given IV to delineate the left ventricular endocardial borders. The left ventricular internal cavity size was normal in size. There is mild left ventricular hypertrophy.  Right Ventricle: The right ventricular size is normal. Right vetricular wall thickness was not assessed. Right ventricular systolic function is low normal.  Left Atrium: Left atrial size was normal in size.  Right Atrium: Right atrial size was normal in size.  Pericardium: There is no evidence of  pericardial effusion.  Mitral Valve: The mitral valve is normal in structure. Trivial mitral valve regurgitation.  Tricuspid Valve: The tricuspid valve is normal in structure. Tricuspid valve regurgitation is trivial.  Aortic Valve: The aortic valve is tricuspid. Aortic valve regurgitation is not visualized. Aortic valve sclerosis/calcification is present, without any evidence of aortic stenosis.  Pulmonic Valve: The pulmonic valve was not well visualized. Pulmonic valve regurgitation is not visualized. No evidence of pulmonic stenosis.  Aorta: The aortic root and ascending aorta are structurally normal, with no evidence of dilitation.  Venous: The inferior vena cava is normal in size with greater than 50% respiratory variability, suggesting right atrial pressure of 3 mmHg.  IAS/Shunts: No atrial level shunt detected by color flow Doppler.   LEFT VENTRICLE PLAX 2D LVIDd:         4.78 cm   Diastology LVIDs:         3.77 cm   LV e' medial:    5.66 cm/s LV PW:         1.18 cm   LV E/e' medial:  10.2 LV IVS:        1.22 cm   LV  e' lateral:   9.57 cm/s LVOT diam:     2.20 cm   LV E/e' lateral: 6.0 LV SV:         63 LV SV Index:   29 LVOT Area:     3.80 cm   RIGHT VENTRICLE RV Basal diam:  4.08 cm RV Mid diam:    4.62 cm RV S prime:     11.60 cm/s TAPSE (M-mode): 1.8 cm  LEFT ATRIUM             Index        RIGHT ATRIUM           Index LA diam:        4.10 cm 1.92 cm/m   RA Area:     11.10 cm LA Vol (A2C):   45.7 ml 21.36 ml/m  RA Volume:   26.70 ml  12.48 ml/m LA Vol (A4C):   34.8 ml 16.26 ml/m LA Biplane Vol: 40.8 ml 19.07 ml/m AORTIC VALVE LVOT Vmax:   68.10 cm/s LVOT Vmean:  43.500 cm/s LVOT VTI:    0.165 m  AORTA Ao Root diam: 4.00 cm Ao Asc diam:  4.00 cm  MITRAL VALVE MV Area (PHT): 3.48 cm    SHUNTS MV Decel Time: 218 msec    Systemic VTI:  0.16 m MV E velocity: 57.60 cm/s  Systemic Diam: 2.20 cm MV A velocity: 78.10 cm/s MV E/A ratio:  0.74  Dietrich Pates MD Electronically signed by Dietrich Pates MD Signature Date/Time: 08/26/2023/4:53:30 PM    Final    MONITORS  CARDIAC EVENT MONITOR 06/11/2023  Narrative Normal sinus rhythm Occasional non sustained ventricular lasting only 4 beats No sustained arrhythmias   CT SCANS  CT CORONARY FRACTIONAL FLOW RESERVE DATA PREP 03/29/2020  Narrative CLINICAL DATA:  Chest pain  EXAM: CT FFR  MEDICATIONS: No additional medications  TECHNIQUE: The coronary CT was sent for FFR  FINDINGS: Unable to process for FFR due to poor quality/artifact.  Dalton Mclean   Electronically Signed By: Marca Ancona M.D. On: 03/30/2020 14:52   CT CORONARY MORPH W/CTA COR W/SCORE 03/24/2020  Addendum 03/24/2020 10:01 PM ADDENDUM REPORT: 03/24/2020 21:58  CLINICAL DATA:  Chest pain  EXAM: Cardiac CTA  MEDICATIONS: Sub lingual nitro. 4mg  x 2  TECHNIQUE: The patient was scanned on a Siemens 192 slice scanner. Gantry rotation speed was 250 msecs. Collimation was 0.6 mm. A 100 kV prospective scan was triggered in the ascending thoracic aorta at 35-75% of the R-R interval. Average HR during the scan was 60 bpm. The 3D data set was interpreted on a dedicated work station using MPR, MIP and VRT modes. A total of 80cc of contrast was used.  FINDINGS: Non-cardiac: See separate report from [facility].  Pulmonary veins drain normally to the left atrium.  Calcium Score: 1805 Agatston units.  Coronary Arteries: Right dominant with no anomalies  LM: Mixed plaque mid left main, minimal stenosis.  LAD system: Extensive calcified plaque proximal LAD, suspect mild (<50%) stenosis. Calcified plaque mid LAD, minimal stenosis. There is a large early D1 with extensive calcified plaque, mild (<50%) stenosis.  Circumflex system: Large early OM1, mixed plaque noted with minimal stenosis. Distal to OM1, the AV LCx is small in caliber with diffuse calcified plaque. There is significant  blooming artifact in the small caliber vessel, but suspect no more than mild (<50%) stenosis.  RCA system: Calcified plaque throughout the RCA. No more than mild (<50%) stenosis.  IMPRESSION: 1. Coronary  artery calcium score 1805 Agatston units. This places the patient in the 94th percentile for age and gender, suggesting high risk for future cardiac events.  2. Extensive coronary plaque. I suspect that there is no stenosis >50%, but will send for FFR to confirm.  Dalton Mclean   Electronically Signed By: Marca Ancona M.D. On: 03/24/2020 21:58  Narrative EXAM: OVER-READ INTERPRETATION  CT CHEST  The following report is an over-read performed by radiologist Dr. Trudie Reed of [facility] on 03/24/2020. This over-read does not include interpretation of cardiac or coronary anatomy or pathology. The coronary calcium score/coronary CTA interpretation by the cardiologist is attached.  COMPARISON:  None.  FINDINGS: Aortic atherosclerosis. Within the visualized portions of the thorax there are no suspicious appearing pulmonary nodules or masses, there is no acute consolidative airspace disease, no pleural effusions, no pneumothorax and no lymphadenopathy. Visualized portions of the upper abdomen are unremarkable. There are no aggressive appearing lytic or blastic lesions noted in the visualized portions of the skeleton.  IMPRESSION: 1.  Aortic Atherosclerosis (ICD10-I70.0).  Electronically Signed: By: Trudie Reed M.D. On: 03/24/2020 08:49     ______________________________________________________________________________________________       Risk Assessment/Calculations:             Physical Exam:   VS:  BP 118/76 (BP Location: Left Arm, Patient Position: Sitting, Cuff Size: Large)   Pulse 74   Resp (!) 94   Ht 5\' 8"  (1.727 m)   Wt 227 lb 9.6 oz (103.2 kg)   BMI 34.61 kg/m    Wt Readings from Last 3 Encounters:  02/24/24 227 lb 9.6 oz  (103.2 kg)  10/24/23 228 lb (103.4 kg)  08/15/23 228 lb 6.4 oz (103.6 kg)    GEN: Well nourished, well developed in no acute distress NECK: No JVD; No carotid bruits CARDIAC: RRR, no murmurs, rubs, gallops RESPIRATORY:  Clear to auscultation without rales, wheezing or rhonchi  ABDOMEN: Soft, non-tender, non-distended EXTREMITIES:  No edema; No deformity   ASSESSMENT AND PLAN: .    Fatigue/shortness of breath Seems to be progressive since December 2024.  His symptoms do not seem to be related to CHF, he's euvolemic, does not have any orthopnea.  Sometimes exertional/nonexertional.  Suspect he could have had progressive CAD although he has no interest in repeating ischemic eval/cath at this time.   Other secondary causes seem unlikely no obvious signs of anemia, thyroid dysfunction, OSA, etc.  At this time we would like to try reducing his Entresto back down to 24-26 mg twice daily to see if this helps since this has been around the timing of his onset of symptoms.  Also asked him to see his PCP to rule out any other secondary etiologies.  Encouraged weight loss and lifestyle modifications. If no significant improvement would discuss with him about possible ischemic evaluation with concerns of progressive CAD.  For now he has deferred. If this does not improve his symptoms in the next 1 to 2 weeks he will go back up to his normal 49-51 mg twice daily.  BP is well-controlled but he will keep a log and let us know if he has elevated readings.  Nonobstructive CAD Hyperlipidemia Last cardiac catheterization March 2024, no significant changes from 2023. He had 60% stenosis in mid LAD, 95% stenosis first diagonal, 80% stenosis in the RPDA and mid LCx.  Medical management recommended. Continue aspirin, Praluent, rosuvastatin 20 mg LDL 64 1 month ago, showing improvement from previous.  Goal is  less than 70.  Chronic heart failure with recovered EF - 2023 EF 30 to 35% - 01/2023 normalized EF -  08/2023 EF 55-60% Recent progressive SOB, euvolemic. Deferring ischemic eval as above. Med changes as above.  Continue Coreg 6.25 mg twice daily, Farxiga 10 mg, decrease Entresto 49-51mg  to 24-26 mg BID, Lasix 20 mg Had hyperkalemia with spironolactone  Hypertension Well-controlled in the context of above.    Dispo: 6 months.  He has not had any issues with Dr. Antoine Poche however he really got along well with Dr. Herbie Baltimore and appreciates people who are direct with him.  He has requested to transition care to him.  If okay by both MDs will make this change.  Signed, Abagail Kitchens, PA-C

## 2024-02-24 ENCOUNTER — Ambulatory Visit: Payer: PPO | Admitting: Adult Health

## 2024-02-24 ENCOUNTER — Encounter: Payer: Self-pay | Admitting: Nurse Practitioner

## 2024-02-24 ENCOUNTER — Ambulatory Visit: Attending: Nurse Practitioner | Admitting: Cardiology

## 2024-02-24 VITALS — BP 118/76 | HR 74 | Resp 94 | Ht 68.0 in | Wt 227.6 lb

## 2024-02-24 DIAGNOSIS — I25110 Atherosclerotic heart disease of native coronary artery with unstable angina pectoris: Secondary | ICD-10-CM | POA: Diagnosis not present

## 2024-02-24 DIAGNOSIS — I5032 Chronic diastolic (congestive) heart failure: Secondary | ICD-10-CM | POA: Diagnosis not present

## 2024-02-24 DIAGNOSIS — I10 Essential (primary) hypertension: Secondary | ICD-10-CM

## 2024-02-24 DIAGNOSIS — E785 Hyperlipidemia, unspecified: Secondary | ICD-10-CM

## 2024-02-24 DIAGNOSIS — I5020 Unspecified systolic (congestive) heart failure: Secondary | ICD-10-CM

## 2024-02-24 NOTE — Patient Instructions (Addendum)
 Medication Instructions:  Decrease Entresto 24-26 mg twice daily  *If you need a refill on your cardiac medications before your next appointment, please call your pharmacy*  Lab Work: NONE ordered at this time of appointment   Testing/Procedures: NONE ordered at this time of appointment   Follow-Up: At [facility], you and your health needs are our priority.  As part of our continuing mission to provide you with exceptional heart care, our providers are all part of one team.  This team includes your primary Cardiologist (physician) and Advanced Practice Providers or APPs (Physician Assistants and Nurse Practitioners) who all work together to provide you with the care you need, when you need it.  Your next appointment:   6 month(s)  Provider:   Dr. Herbie Erickson    We recommend signing up for the patient portal called "MyChart".  Sign up information is provided on this After Visit Summary.  MyChart is used to connect with patients for Virtual Visits (Telemedicine).  Patients are able to view lab/test results, encounter notes, upcoming appointments, etc.  Non-urgent messages can be sent to your provider as well.   To learn more about what you can do with MyChart, go to [email or web address].   Other Instructions Monitor blood pressure. If blood pressure in the 140s (top number) after reducing Entresto,please report readings via  mychart or call.       1st Floor: - Lobby - Registration  - Pharmacy  - Lab - Cafe  2nd Floor: - PV Lab - Diagnostic Testing (echo, CT, nuclear med)  3rd Floor: - Vacant  4th Floor: - TCTS (cardiothoracic surgery) - AFib Clinic - Structural Heart Clinic - Vascular Surgery  - Vascular Ultrasound  5th Floor: - HeartCare Cardiology (general and EP) - Clinical Pharmacy for coumadin, hypertension, lipid, weight-loss medications, and med management appointments    Valet parking services will be available as well.

## 2024-03-02 ENCOUNTER — Other Ambulatory Visit: Payer: Self-pay | Admitting: Cardiology

## 2024-03-03 MED ORDER — ENTRESTO 24-26 MG PO TABS: 1.0000 | ORAL_TABLET | Freq: Two times a day (BID) | ORAL | 3 refills | Status: AC

## 2024-07-10 ENCOUNTER — Other Ambulatory Visit: Payer: Self-pay | Admitting: Cardiology

## 2024-09-03 DIAGNOSIS — Z0000 Encounter for general adult medical examination without abnormal findings: Secondary | ICD-10-CM | POA: Diagnosis not present

## 2024-09-03 DIAGNOSIS — R972 Elevated prostate specific antigen [PSA]: Secondary | ICD-10-CM | POA: Diagnosis not present

## 2024-09-03 DIAGNOSIS — I10 Essential (primary) hypertension: Secondary | ICD-10-CM | POA: Diagnosis not present

## 2024-09-03 DIAGNOSIS — E1169 Type 2 diabetes mellitus with other specified complication: Secondary | ICD-10-CM | POA: Diagnosis not present

## 2024-09-03 DIAGNOSIS — R5383 Other fatigue: Secondary | ICD-10-CM | POA: Diagnosis not present

## 2024-09-03 DIAGNOSIS — E785 Hyperlipidemia, unspecified: Secondary | ICD-10-CM | POA: Diagnosis not present

## 2024-09-03 DIAGNOSIS — I5022 Chronic systolic (congestive) heart failure: Secondary | ICD-10-CM | POA: Diagnosis not present

## 2024-09-25 ENCOUNTER — Other Ambulatory Visit: Payer: Self-pay | Admitting: Cardiology

## 2024-10-25 ENCOUNTER — Other Ambulatory Visit: Payer: Self-pay | Admitting: Student
